# Patient Record
Sex: Male | Born: 1970
Health system: Southern US, Community
[De-identification: ages and names within clinical notes are randomized; demographics above are authoritative.]

## PROBLEM LIST (undated history)

## (undated) DIAGNOSIS — J45909 Unspecified asthma, uncomplicated: Secondary | ICD-10-CM

## (undated) DIAGNOSIS — K219 Gastro-esophageal reflux disease without esophagitis: Secondary | ICD-10-CM

## (undated) DIAGNOSIS — D169 Benign neoplasm of bone and articular cartilage, unspecified: Secondary | ICD-10-CM

## (undated) DIAGNOSIS — C801 Malignant (primary) neoplasm, unspecified: Secondary | ICD-10-CM

## (undated) DIAGNOSIS — R112 Nausea with vomiting, unspecified: Secondary | ICD-10-CM

## (undated) DIAGNOSIS — M1990 Unspecified osteoarthritis, unspecified site: Secondary | ICD-10-CM

## (undated) DIAGNOSIS — T7840XA Allergy, unspecified, initial encounter: Secondary | ICD-10-CM

## (undated) DIAGNOSIS — Z9889 Other specified postprocedural states: Secondary | ICD-10-CM

## (undated) DIAGNOSIS — T8859XA Other complications of anesthesia, initial encounter: Secondary | ICD-10-CM

## (undated) HISTORY — DX: Allergy, unspecified, initial encounter: T78.40XA

## (undated) HISTORY — PX: OSTEOTOMY FIBULA: SHX2141

## (undated) HISTORY — PX: DENTAL SURGERY: SHX609

## (undated) HISTORY — PX: UPPER GASTROINTESTINAL ENDOSCOPY: SHX188

## (undated) HISTORY — PX: WISDOM TOOTH EXTRACTION: SHX21

## (undated) HISTORY — PX: COLON SURGERY: SHX602

## (undated) HISTORY — DX: Gastro-esophageal reflux disease without esophagitis: K21.9

## (undated) HISTORY — PX: OTHER SURGICAL HISTORY: SHX169

## (undated) HISTORY — PX: NASAL RECONSTRUCTION: SHX2069

## (undated) HISTORY — DX: Benign neoplasm of bone and articular cartilage, unspecified: D16.9

---

## 2006-04-27 ENCOUNTER — Ambulatory Visit: Payer: Self-pay | Admitting: Family Medicine

## 2006-05-19 ENCOUNTER — Ambulatory Visit: Payer: Self-pay

## 2009-03-22 ENCOUNTER — Emergency Department (HOSPITAL_COMMUNITY): Admission: EM | Admit: 2009-03-22 | Discharge: 2009-03-22 | Payer: Self-pay | Admitting: Emergency Medicine

## 2009-03-25 ENCOUNTER — Emergency Department (HOSPITAL_COMMUNITY): Admission: EM | Admit: 2009-03-25 | Discharge: 2009-03-25 | Payer: Self-pay | Admitting: Emergency Medicine

## 2009-03-29 ENCOUNTER — Encounter (HOSPITAL_COMMUNITY): Admission: RE | Admit: 2009-03-29 | Discharge: 2009-06-27 | Payer: Self-pay | Admitting: Emergency Medicine

## 2016-11-24 DIAGNOSIS — M545 Low back pain, unspecified: Secondary | ICD-10-CM | POA: Insufficient documentation

## 2016-11-24 DIAGNOSIS — R072 Precordial pain: Secondary | ICD-10-CM | POA: Insufficient documentation

## 2018-08-16 DIAGNOSIS — M545 Low back pain: Secondary | ICD-10-CM | POA: Diagnosis not present

## 2018-08-16 DIAGNOSIS — G8929 Other chronic pain: Secondary | ICD-10-CM | POA: Diagnosis not present

## 2019-11-11 DIAGNOSIS — C801 Malignant (primary) neoplasm, unspecified: Secondary | ICD-10-CM

## 2019-11-11 HISTORY — DX: Malignant (primary) neoplasm, unspecified: C80.1

## 2020-03-23 ENCOUNTER — Encounter: Payer: Self-pay | Admitting: Physician Assistant

## 2020-03-23 ENCOUNTER — Ambulatory Visit (INDEPENDENT_AMBULATORY_CARE_PROVIDER_SITE_OTHER): Payer: BC Managed Care – PPO | Admitting: Physician Assistant

## 2020-03-23 ENCOUNTER — Other Ambulatory Visit: Payer: Self-pay

## 2020-03-23 VITALS — BP 108/64 | HR 59 | Temp 98.3°F | Resp 16 | Ht 68.0 in | Wt 173.2 lb

## 2020-03-23 DIAGNOSIS — K222 Esophageal obstruction: Secondary | ICD-10-CM

## 2020-03-23 DIAGNOSIS — Z1211 Encounter for screening for malignant neoplasm of colon: Secondary | ICD-10-CM

## 2020-03-23 DIAGNOSIS — Z125 Encounter for screening for malignant neoplasm of prostate: Secondary | ICD-10-CM | POA: Diagnosis not present

## 2020-03-23 DIAGNOSIS — Z0000 Encounter for general adult medical examination without abnormal findings: Secondary | ICD-10-CM

## 2020-03-23 DIAGNOSIS — R0789 Other chest pain: Secondary | ICD-10-CM | POA: Diagnosis not present

## 2020-03-23 DIAGNOSIS — K2100 Gastro-esophageal reflux disease with esophagitis, without bleeding: Secondary | ICD-10-CM | POA: Diagnosis not present

## 2020-03-23 LAB — CBC WITH DIFFERENTIAL/PLATELET
Basophils Absolute: 0.1 10*3/uL (ref 0.0–0.1)
Basophils Relative: 1.5 % (ref 0.0–3.0)
Eosinophils Absolute: 0.1 10*3/uL (ref 0.0–0.7)
Eosinophils Relative: 1.9 % (ref 0.0–5.0)
HCT: 43.7 % (ref 39.0–52.0)
Hemoglobin: 14.8 g/dL (ref 13.0–17.0)
Lymphocytes Relative: 42.8 % (ref 12.0–46.0)
Lymphs Abs: 2.1 10*3/uL (ref 0.7–4.0)
MCHC: 33.8 g/dL (ref 30.0–36.0)
MCV: 92.3 fl (ref 78.0–100.0)
Monocytes Absolute: 0.3 10*3/uL (ref 0.1–1.0)
Monocytes Relative: 7.2 % (ref 3.0–12.0)
Neutro Abs: 2.3 10*3/uL (ref 1.4–7.7)
Neutrophils Relative %: 46.6 % (ref 43.0–77.0)
Platelets: 251 10*3/uL (ref 150.0–400.0)
RBC: 4.74 Mil/uL (ref 4.22–5.81)
RDW: 13.8 % (ref 11.5–15.5)
WBC: 4.8 10*3/uL (ref 4.0–10.5)

## 2020-03-23 LAB — COMPREHENSIVE METABOLIC PANEL
ALT: 18 U/L (ref 0–53)
AST: 18 U/L (ref 0–37)
Albumin: 4.5 g/dL (ref 3.5–5.2)
Alkaline Phosphatase: 52 U/L (ref 39–117)
BUN: 12 mg/dL (ref 6–23)
CO2: 30 mEq/L (ref 19–32)
Calcium: 9.3 mg/dL (ref 8.4–10.5)
Chloride: 102 mEq/L (ref 96–112)
Creatinine, Ser: 0.9 mg/dL (ref 0.40–1.50)
GFR: 89.17 mL/min (ref >60.00)
Glucose, Bld: 81 mg/dL (ref 70–99)
Potassium: 5 mEq/L (ref 3.5–5.1)
Sodium: 138 mEq/L (ref 135–145)
Total Bilirubin: 1 mg/dL (ref 0.2–1.2)
Total Protein: 7 g/dL (ref 6.0–8.3)

## 2020-03-23 LAB — LIPID PANEL
Cholesterol: 191 mg/dL (ref 0–200)
HDL: 56.2 mg/dL (ref >39.00)
LDL Cholesterol: 123 mg/dL — ABNORMAL HIGH (ref 0–99)
NonHDL: 134.83
Total CHOL/HDL Ratio: 3
Triglycerides: 57 mg/dL (ref 0.0–149.0)
VLDL: 11.4 mg/dL (ref 0.0–40.0)

## 2020-03-23 LAB — TSH: TSH: 2.03 u[IU]/mL (ref 0.35–4.50)

## 2020-03-23 LAB — PSA: PSA: 1.08 ng/mL (ref 0.10–4.00)

## 2020-03-23 LAB — H. PYLORI ANTIBODY, IGG: H Pylori IgG: NEGATIVE

## 2020-03-23 MED ORDER — PANTOPRAZOLE SODIUM 40 MG PO TBEC: 40.0000 mg | DELAYED_RELEASE_TABLET | Freq: Every day | ORAL | 3 refills | Status: DC

## 2020-03-23 NOTE — Progress Notes (Signed)
Patient presents to clinic today to establish care.  Acute Concerns: Patient endorses around a year-long history of intermittent heartburn and reflux, becoming more consistent over the past couple of months. Is associated with difficulty swallowing with patient noting that he sometimes gets choked on foods and now even liquids on rare occasion.  Has noted occasional more r-sided chest discomfort in the past 5 weeks. States more so in the pectoral region. ?Notes some pain with movement on occasion and occasionally will note some discomfort with a deep breath. Can occur at rest and is not worsened with exertion. Denies fever, chills. Denies SOB or racing heart.   Health Maintenance: Immunizations -- Has had COVID vaccines. Unsure of last Tetanus. Will check records.   Colonoscopy -- Due. Average risk. Asymptomatic.   Past Medical History:  Diagnosis Date  . Allergy     Past Surgical History:  Procedure Laterality Date  . bonegraft     osteoasteoma  . DENTAL SURGERY      Current Outpatient Medications on File Prior to Visit  Medication Sig Dispense Refill  . cetirizine (ZYRTEC) 10 MG tablet Take 10 mg by mouth daily.     No current facility-administered medications on file prior to visit.    Allergies  Allergen Reactions  . Iodinated Diagnostic Agents Other (See Comments)    Family History  Problem Relation Age of Onset  . Dementia Mother   . Hypertension Father   . COPD Father   . Autism Daughter   . Depression Daughter     Social History   Socioeconomic History  . Marital status: Married    Spouse name: Not on file  . Number of children: Not on file  . Years of education: Not on file  . Highest education level: Not on file  Occupational History  . Not on file  Tobacco Use  . Smoking status: Never Smoker  . Smokeless tobacco: Never Used  Substance and Sexual Activity  . Alcohol use: Yes    Alcohol/week: 3.0 standard drinks    Types: 1 Glasses of wine, 1  Cans of beer, 1 Shots of liquor per week    Comment: ocassionally  . Drug use: Never  . Sexual activity: Not on file  Other Topics Concern  . Not on file  Social History Narrative  . Not on file   Social Determinants of Health   Financial Resource Strain:   . Difficulty of Paying Living Expenses:   Food Insecurity:   . Worried About Charity fundraiser in the Last Year:   . Arboriculturist in the Last Year:   Transportation Needs:   . Film/video editor (Medical):   Marland Kitchen Lack of Transportation (Non-Medical):   Physical Activity:   . Days of Exercise per Week:   . Minutes of Exercise per Session:   Stress:   . Feeling of Stress :   Social Connections:   . Frequency of Communication with Friends and Family:   . Frequency of Social Gatherings with Friends and Family:   . Attends Religious Services:   . Active Member of Clubs or Organizations:   . Attends Archivist Meetings:   Marland Kitchen Marital Status:   Intimate Partner Violence:   . Fear of Current or Ex-Partner:   . Emotionally Abused:   Marland Kitchen Physically Abused:   . Sexually Abused:    ROS Pertinent ROS are listed in the HPI.  BP 108/64   Pulse (!) 59  Temp 98.3 F (36.8 C)   Resp 16   Ht 5\' 8"  (1.727 m)   Wt 173 lb 3.2 oz (78.6 kg)   SpO2 99%   BMI 26.33 kg/m   Physical Exam Vitals reviewed.  Constitutional:      Appearance: Normal appearance.  HENT:     Head: Normocephalic and atraumatic.     Right Ear: Tympanic membrane normal.     Left Ear: Tympanic membrane normal.     Mouth/Throat:     Mouth: Mucous membranes are moist.  Eyes:     Conjunctiva/sclera: Conjunctivae normal.     Pupils: Pupils are equal, round, and reactive to light.  Cardiovascular:     Rate and Rhythm: Normal rate and regular rhythm.     Pulses: Normal pulses.     Heart sounds: Normal heart sounds.  Pulmonary:     Effort: Pulmonary effort is normal.     Breath sounds: Normal breath sounds.  Chest:     Chest wall: No  tenderness.  Abdominal:     General: Bowel sounds are normal. There is no distension.     Palpations: Abdomen is soft. There is no mass.     Tenderness: There is no abdominal tenderness. There is no guarding or rebound.     Hernia: No hernia is present.  Musculoskeletal:     Cervical back: Neck supple.  Neurological:     General: No focal deficit present.     Mental Status: He is alert and oriented to person, place, and time.  Psychiatric:        Mood and Affect: Mood normal.     Assessment/Plan: 1. Visit for preventive health examination Depression screen negative. Health Maintenance reviewed -- will get imm records. Preventive schedule discussed and handout given in AVS. Will obtain fasting labs today.  - CBC with Differential/Platelet - Comprehensive metabolic panel - Lipid panel - TSH  2. Prostate cancer screening The natural history of prostate cancer has been discussed with the patient. He indicates understanding of the limitations of this screening test and wishes to proceed with screening PSA testing.  - PSA  3. Atypical chest pain Seems combination of MSK inflammation and GERD. Examination unremarkable today. EKG today with sinus bradycardia. No concerning findings. Will start patient on PPI therapy and GERD diet for GERD. Trying to avoid NSAIDs due to GERD. Supportive measures and OTC medications reviewed. Avoid heavy lifting. Recheck 2 weeks.  - EKG 12-Lead - pantoprazole (PROTONIX) 40 MG tablet; Take 1 tablet (40 mg total) by mouth daily.  Dispense: 30 tablet; Refill: 3  4. Gastroesophageal reflux disease with esophagitis without hemorrhage Significant and concern for stricture. Start Protonix 40 mg daily along with GERD diet. Labs today to include h pylori testing. Follow-up scheduled.  - pantoprazole (PROTONIX) 40 MG tablet; Take 1 tablet (40 mg total) by mouth daily.  Dispense: 30 tablet; Refill: 3 - Comprehensive metabolic panel - Lipid panel - H. pylori  antibody, IgG  5. Esophageal stricture PPI as directed. Referral placed to GI for EGD and further management if present.  - pantoprazole (PROTONIX) 40 MG tablet; Take 1 tablet (40 mg total) by mouth daily.  Dispense: 30 tablet; Refill: 3   Leeanne Rio, [place]

## 2020-03-23 NOTE — Patient Instructions (Signed)
Please go to the lab today for blood work.  I will call you with your results. We will alter treatment regimen(s) if indicated by your results.   Please start nightly cyclobenzaprine over the next week. I want to see if this eases muscular tension in the chest and resolves those r-sided symptoms. If not improving will proceed with imaging.   For the bad reflux, start the diet below. Also take the Protonix once daily. Giving concern for stricture we are setting you up with Gastroenterology. They will also help facilitate your colon cancer screenings.   Follow-up in 2 weeks.   Food Choices for Gastroesophageal Reflux Disease, Adult When you have gastroesophageal reflux disease (GERD), the foods you eat and your eating habits are very important. Choosing the right foods can help ease your discomfort. Think about working with a nutrition specialist (dietitian) to help you make good choices. What are tips for following this plan?  Meals  Choose healthy foods that are low in fat, such as fruits, vegetables, whole grains, low-fat dairy products, and lean meat, fish, and poultry.  Eat small meals often instead of 3 large meals a day. Eat your meals slowly, and in a place where you are relaxed. Avoid bending over or lying down until 2-3 hours after eating.  Avoid eating meals 2-3 hours before bed.  Avoid drinking a lot of liquid with meals.  Cook foods using methods other than frying. Bake, grill, or broil food instead.  Avoid or limit: ? Chocolate. ? Peppermint or spearmint. ? Alcohol. ? Pepper. ? Black and decaffeinated coffee. ? Black and decaffeinated tea. ? Bubbly (carbonated) soft drinks. ? Caffeinated energy drinks and soft drinks.  Limit high-fat foods such as: ? Fatty meat or fried foods. ? Whole milk, cream, butter, or ice cream. ? Nuts and nut butters. ? Pastries, donuts, and sweets made with butter or shortening.  Avoid foods that cause symptoms. These foods may be  different for everyone. Common foods that cause symptoms include: ? Tomatoes. ? Oranges, lemons, and limes. ? Peppers. ? Spicy food. ? Onions and garlic. ? Vinegar. Lifestyle  Maintain a healthy weight. Ask your doctor what weight is healthy for you. If you need to lose weight, work with your doctor to do so safely.  Exercise for at least 30 minutes for 5 or more days each week, or as told by your doctor.  Wear loose-fitting clothes.  Do not smoke. If you need help quitting, ask your doctor.  Sleep with the head of your bed higher than your feet. Use a wedge under the mattress or blocks under the bed frame to raise the head of the bed. Summary  When you have gastroesophageal reflux disease (GERD), food and lifestyle choices are very important in easing your symptoms.  Eat small meals often instead of 3 large meals a day. Eat your meals slowly, and in a place where you are relaxed.  Limit high-fat foods such as fatty meat or fried foods.  Avoid bending over or lying down until 2-3 hours after eating.  Avoid peppermint and spearmint, caffeine, alcohol, and chocolate. This information is not intended to replace advice given to you by your health care provider. Make sure you discuss any questions you have with your health care provider. Document Revised: 02/17/2019 Document Reviewed: 12/02/2016 Elsevier Patient Education  2020 Else[employer] Inc[employer]   Preventive Care 61-87 Years Old, Male Preventive care refers to lifestyle choices and visits with your health care provider that can promote health  and wellness. This includes:  A yearly physical exam. This is also called an annual well check.  Regular dental and eye exams.  Immunizations.  Screening for certain conditions.  Healthy lifestyle choices, such as eating a healthy diet, getting regular exercise, not using drugs or products that contain nicotine and tobacco, and limiting alcohol use. What can I expect for my preventive care  visit? Physical exam Your health care provider will check:  Height and weight. These may be used to calculate body mass index (BMI), which is a measurement that tells if you are at a healthy weight.  Heart rate and blood pressure.  Your skin for abnormal spots. Counseling Your health care provider may ask you questions about:  Alcohol, tobacco, and drug use.  Emotional well-being.  Home and relationship well-being.  Sexual activity.  Eating habits.  Work and work Statistician. What immunizations do I need?  Influenza (flu) vaccine  This is recommended every year. Tetanus, diphtheria, and pertussis (Tdap) vaccine  You may need a Td booster every 10 years. Varicella (chickenpox) vaccine  You may need this vaccine if you have not already been vaccinated. Zoster (shingles) vaccine  You may need this after age 70. Measles, mumps, and rubella (MMR) vaccine  You may need at least one dose of MMR if you were born in 1957 or later. You may also need a second dose. Pneumococcal conjugate (PCV13) vaccine  You may need this if you have certain conditions and were not previously vaccinated. Pneumococcal polysaccharide (PPSV23) vaccine  You may need one or two doses if you smoke cigarettes or if you have certain conditions. Meningococcal conjugate (MenACWY) vaccine  You may need this if you have certain conditions. Hepatitis A vaccine  You may need this if you have certain conditions or if you travel or work in places where you may be exposed to hepatitis A. Hepatitis B vaccine  You may need this if you have certain conditions or if you travel or work in places where you may be exposed to hepatitis B. Haemophilus influenzae type b (Hib) vaccine  You may need this if you have certain risk factors. Human papillomavirus (HPV) vaccine  If recommended by your health care provider, you may need three doses over 6 months. You may receive vaccines as individual doses or as more  than one vaccine together in one shot (combination vaccines). Talk with your health care provider about the risks and benefits of combination vaccines. What tests do I need? Blood tests  Lipid and cholesterol levels. These may be checked every 5 years, or more frequently if you are over 54 years old.  Hepatitis C test.  Hepatitis B test. Screening  Lung cancer screening. You may have this screening every year starting at age 70 if you have a 30-pack-year history of smoking and currently smoke or have quit within the past 15 years.  Prostate cancer screening. Recommendations will vary depending on your family history and other risks.  Colorectal cancer screening. All adults should have this screening starting at age 103 and continuing until age 19. Your health care provider may recommend screening at age 43 if you are at increased risk. You will have tests every 1-10 years, depending on your results and the type of screening test.  Diabetes screening. This is done by checking your blood sugar (glucose) after you have not eaten for a while (fasting). You may have this done every 1-3 years.  Sexually transmitted disease (STD) testing. Follow these instructions at  home: Eating and drinking  Eat a diet that includes fresh fruits and vegetables, whole grains, lean protein, and low-fat dairy products.  Take vitamin and mineral supplements as recommended by your health care provider.  Do not drink alcohol if your health care provider tells you not to drink.  If you drink alcohol: ? Limit how much you have to 0-2 drinks a day. ? Be aware of how much alcohol is in your drink. In the [place] one drink equals one 12 oz bottle of beer (355 mL), one 5 oz glass of wine (148 mL), or one 1 oz glass of hard liquor (44 mL). Lifestyle  Take daily care of your teeth and gums.  Stay active. Exercise for at least 30 minutes on 5 or more days each week.  Do not use any products that contain nicotine or  tobacco, such as cigarettes, e-cigarettes, and chewing tobacco. If you need help quitting, ask your health care provider.  If you are sexually active, practice safe sex. Use a condom or other form of protection to prevent STIs (sexually transmitted infections).  Talk with your health care provider about taking a low-dose aspirin every day starting at age 12. What's next?  Go to your health care provider once a year for a well check visit.  Ask your health care provider how often you should have your eyes and teeth checked.  Stay up to date on all vaccines. This information is not intended to replace advice given to you by your health care provider. Make sure you discuss any questions you have with your health care provider. Document Revised: 10/21/2018 Document Reviewed: 10/21/2018 Elsevier Patient Education  2020 [employer].

## 2020-03-26 ENCOUNTER — Encounter: Payer: Self-pay | Admitting: Physician Assistant

## 2020-03-26 ENCOUNTER — Other Ambulatory Visit: Payer: Self-pay | Admitting: Physician Assistant

## 2020-03-26 MED ORDER — CYCLOBENZAPRINE HCL 10 MG PO TABS
10.0000 mg | ORAL_TABLET | Freq: Every day | ORAL | 0 refills | Status: DC
Start: 2020-03-26 — End: 2020-04-06

## 2020-04-06 ENCOUNTER — Ambulatory Visit (INDEPENDENT_AMBULATORY_CARE_PROVIDER_SITE_OTHER): Payer: BC Managed Care – PPO

## 2020-04-06 ENCOUNTER — Other Ambulatory Visit: Payer: BC Managed Care – PPO

## 2020-04-06 ENCOUNTER — Other Ambulatory Visit: Payer: Self-pay

## 2020-04-06 ENCOUNTER — Ambulatory Visit (INDEPENDENT_AMBULATORY_CARE_PROVIDER_SITE_OTHER): Payer: BC Managed Care – PPO | Admitting: Physician Assistant

## 2020-04-06 ENCOUNTER — Encounter: Payer: Self-pay | Admitting: Physician Assistant

## 2020-04-06 ENCOUNTER — Other Ambulatory Visit: Payer: Self-pay | Admitting: Emergency Medicine

## 2020-04-06 VITALS — BP 110/70 | HR 80 | Temp 98.1°F | Resp 14 | Ht 68.0 in | Wt 174.0 lb

## 2020-04-06 DIAGNOSIS — K222 Esophageal obstruction: Secondary | ICD-10-CM

## 2020-04-06 DIAGNOSIS — R0789 Other chest pain: Secondary | ICD-10-CM | POA: Diagnosis not present

## 2020-04-06 DIAGNOSIS — K2100 Gastro-esophageal reflux disease with esophagitis, without bleeding: Secondary | ICD-10-CM

## 2020-04-06 DIAGNOSIS — R079 Chest pain, unspecified: Secondary | ICD-10-CM | POA: Diagnosis not present

## 2020-04-06 IMAGING — DX DG CHEST 2V
2 series · 2 of 2 positions shown · non-contrast
Comparison: None.

CLINICAL DATA: Chest pain

EXAM:
CHEST - 2 VIEW

[chest pa]
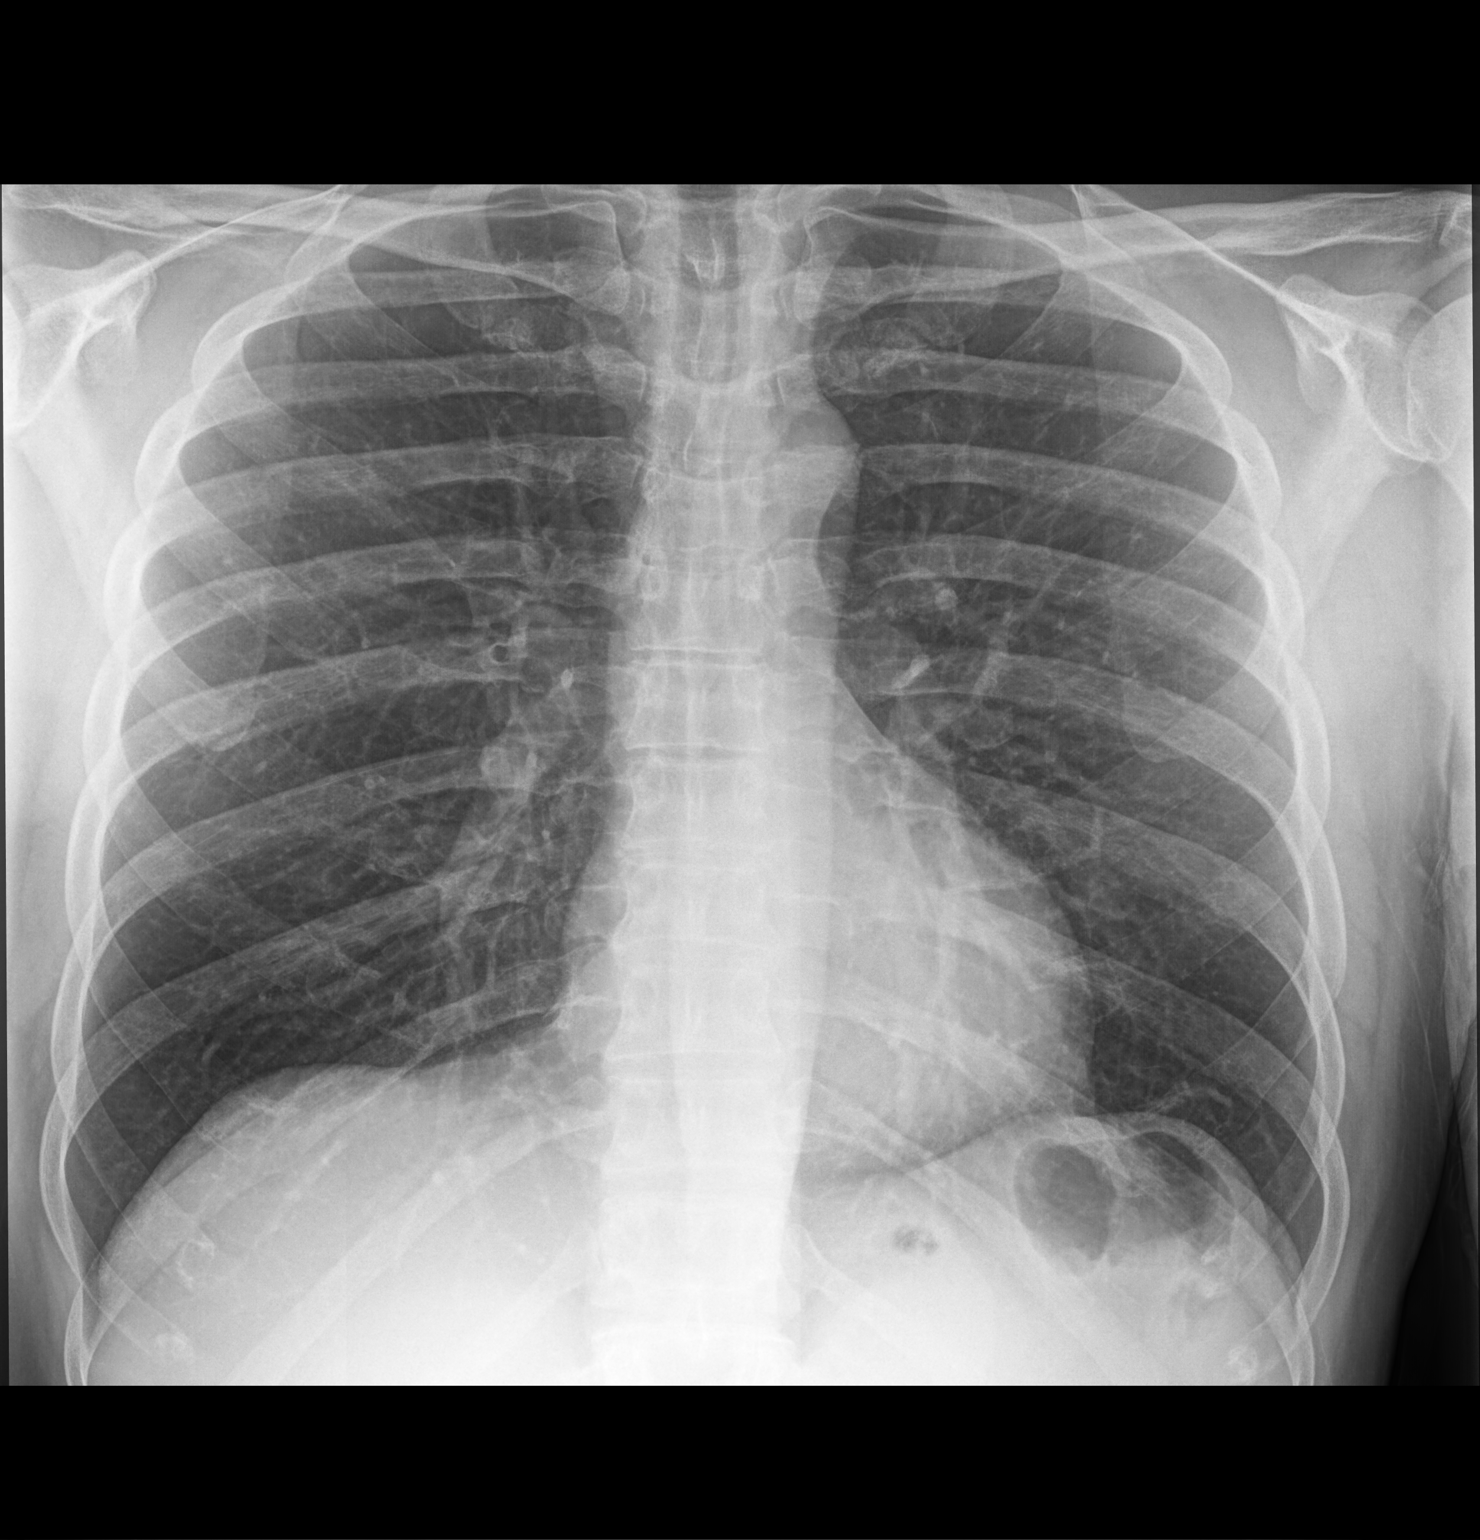

[chest lat]
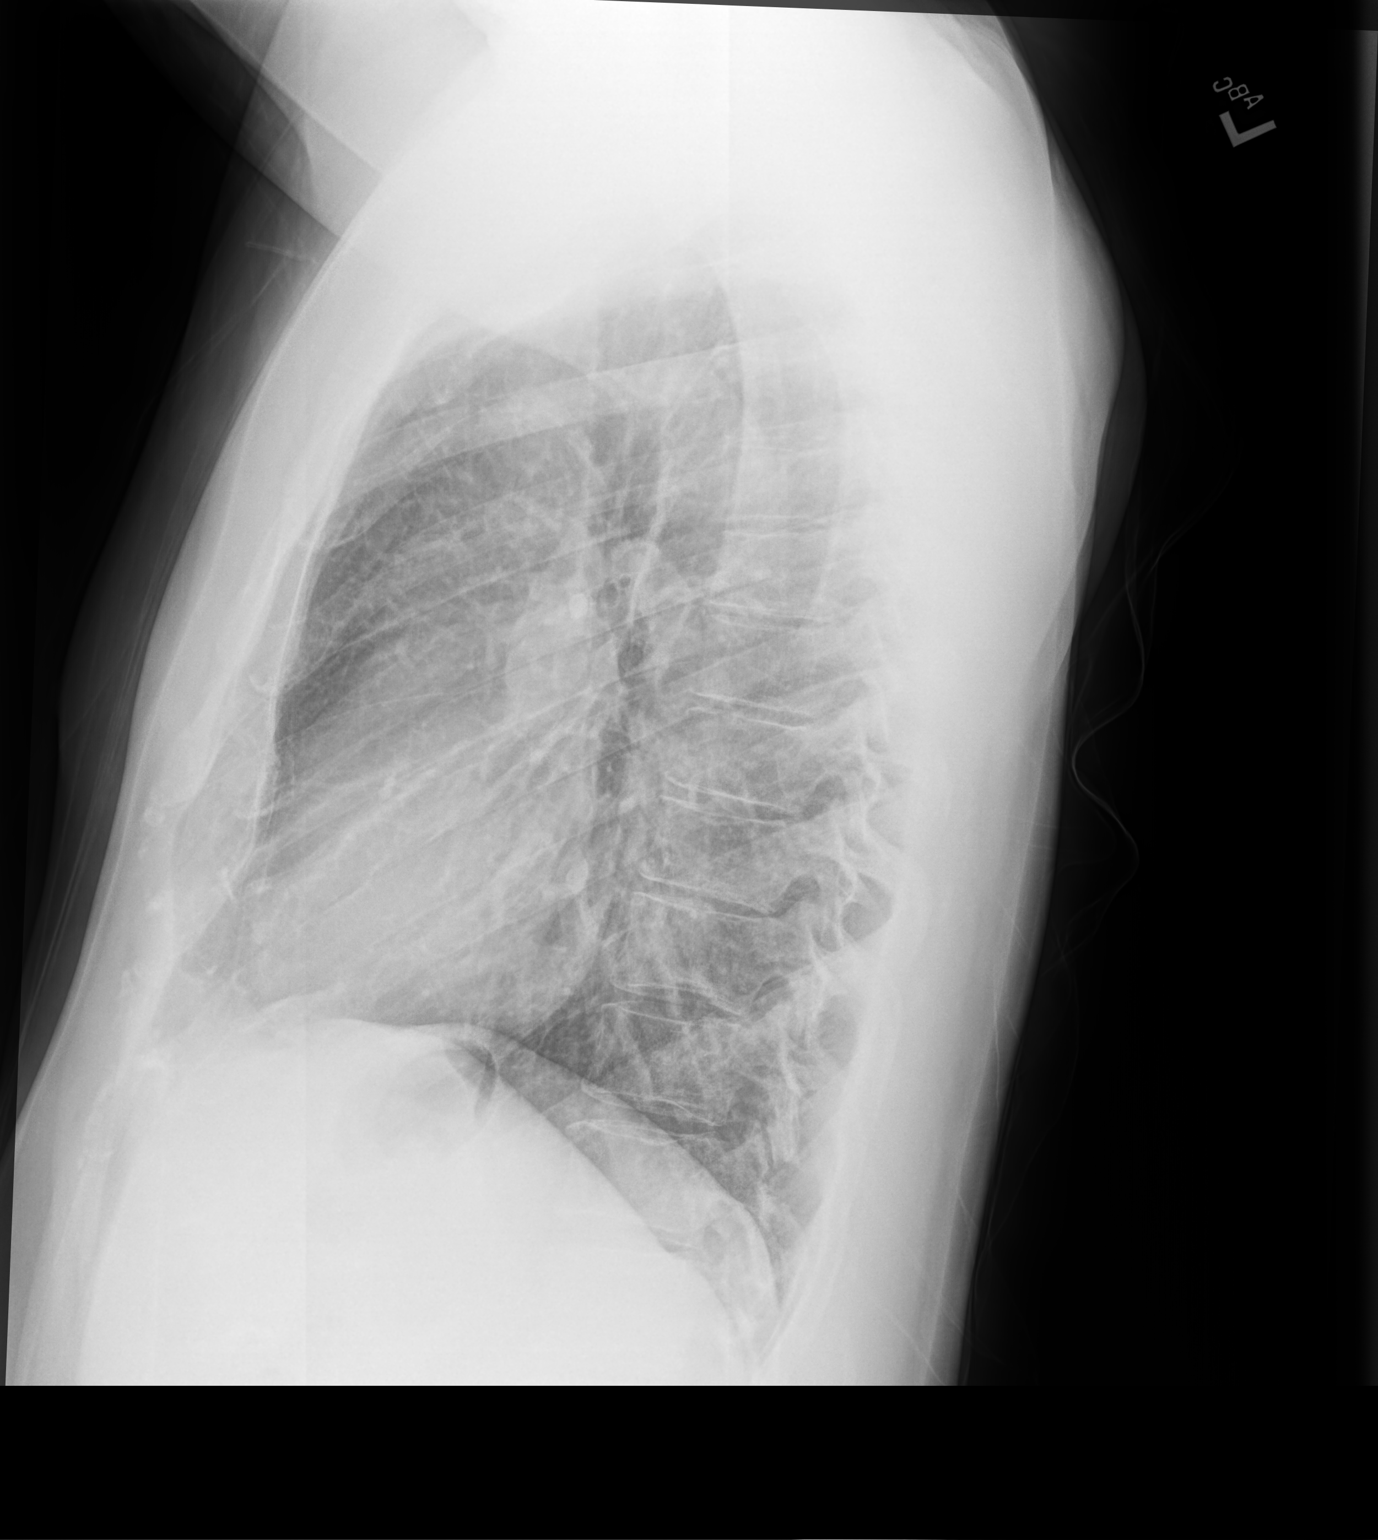

[2 of 2 positions shown; findings below may reference images not displayed]

FINDINGS: Lungs are clear. Heart size and pulmonary vascularity are normal. No
adenopathy. No pneumothorax. No bone lesions.
IMPRESSION: Lungs clear.  Cardiac silhouette within normal limits.

## 2020-04-06 MED ORDER — PANTOPRAZOLE SODIUM 40 MG PO TBEC: 40.0000 mg | DELAYED_RELEASE_TABLET | Freq: Every day | ORAL | 1 refills | Status: DC

## 2020-04-06 NOTE — Patient Instructions (Signed)
Please continue the Protonix daily along with dietary recommendations below.  Continue this through your appointment with Gastroenterology. They may make further changes to your regimen at that time.  Please go up front to speak with Danae Chen to get your x-ray scheduled this morning.   Please go to the [facility] office for x-ray. We will call you with your results and alter treatment accordingly.   [facility] [place]  [place], [place] [address]   Food Choices for Gastroesophageal Reflux Disease, Adult When you have gastroesophageal reflux disease (GERD), the foods you eat and your eating habits are very important. Choosing the right foods can help ease your discomfort. Think about working with a nutrition specialist (dietitian) to help you make good choices. What are tips for following this plan?  Meals  Choose healthy foods that are low in fat, such as fruits, vegetables, whole grains, low-fat dairy products, and lean meat, fish, and poultry.  Eat small meals often instead of 3 large meals a day. Eat your meals slowly, and in a place where you are relaxed. Avoid bending over or lying down until 2-3 hours after eating.  Avoid eating meals 2-3 hours before bed.  Avoid drinking a lot of liquid with meals.  Cook foods using methods other than frying. Bake, grill, or broil food instead.  Avoid or limit: ? Chocolate. ? Peppermint or spearmint. ? Alcohol. ? Pepper. ? Black and decaffeinated coffee. ? Black and decaffeinated tea. ? Bubbly (carbonated) soft drinks. ? Caffeinated energy drinks and soft drinks.  Limit high-fat foods such as: ? Fatty meat or fried foods. ? Whole milk, cream, butter, or ice cream. ? Nuts and nut butters. ? Pastries, donuts, and sweets made with butter or shortening.  Avoid foods that cause symptoms. These foods may be different for everyone. Common foods that cause symptoms include: ? Tomatoes. ? Oranges, lemons, and  limes. ? Peppers. ? Spicy food. ? Onions and garlic. ? Vinegar. Lifestyle  Maintain a healthy weight. Ask your doctor what weight is healthy for you. If you need to lose weight, work with your doctor to do so safely.  Exercise for at least 30 minutes for 5 or more days each week, or as told by your doctor.  Wear loose-fitting clothes.  Do not smoke. If you need help quitting, ask your doctor.  Sleep with the head of your bed higher than your feet. Use a wedge under the mattress or blocks under the bed frame to raise the head of the bed. Summary  When you have gastroesophageal reflux disease (GERD), food and lifestyle choices are very important in easing your symptoms.  Eat small meals often instead of 3 large meals a day. Eat your meals slowly, and in a place where you are relaxed.  Limit high-fat foods such as fatty meat or fried foods.  Avoid bending over or lying down until 2-3 hours after eating.  Avoid peppermint and spearmint, caffeine, alcohol, and chocolate. This information is not intended to replace advice given to you by your health care provider. Make sure you discuss any questions you have with your health care provider. Document Revised: 02/17/2019 Document Reviewed: 12/02/2016 Elsevier Patient Education  [employer].

## 2020-04-06 NOTE — Progress Notes (Signed)
Patient presents to clinic today for follow-up regarding GERD, difficulty swallowing and atypical chest pain.  At last visit patient was started on a regimen of Protonix 40 mg once daily and GERD diet after initial work-up in office including examination, blood work and EKG unremarkable.  Patient endorses taking the Protonix daily as directed.  Has noted substantial improvement in reflux symptoms, noting only 2 episodes of breakthrough symptoms since last visit.  Notes it is easier to swallow but still has occasional difficulty swallowing solids.  Has GI appointment upcoming for further evaluation with EGD.  In regards to chest pain, patient still endorsing intermittent right-sided chest pain, noted as a pulling sensation.  Still noting some discomfort with taking a deep breath.  Denies radiation of pain elsewhere.  Denies shortness of breath.  Has tried avoid heavy lifting and overexertion.  Past Medical History:  Diagnosis Date  . Allergy     Current Outpatient Medications on File Prior to Visit  Medication Sig Dispense Refill  . cetirizine (ZYRTEC) 10 MG tablet Take 10 mg by mouth daily.    . pantoprazole (PROTONIX) 40 MG tablet Take 1 tablet (40 mg total) by mouth daily. 30 tablet 3  . cyclobenzaprine (FLEXERIL) 10 MG tablet Take 1 tablet (10 mg total) by mouth at bedtime. (Patient not taking: Reported on 04/06/2020) 15 tablet 0   No current facility-administered medications on file prior to visit.    Allergies  Allergen Reactions  . Iodinated Diagnostic Agents Other (See Comments)    Jittery, dizziness, palpitations    Family History  Problem Relation Age of Onset  . Dementia Mother 73       Alzheimer's  . Colon polyps Mother   . Hypertension Father   . COPD Father   . Autism Daughter   . Depression Daughter   . Lung cancer Paternal Grandfather        Smoker  . Dementia Maternal Uncle        Alzheimer's  . Lung cancer Paternal Uncle        w/ metastatsis; Unsure of smoking  status    Social History   Socioeconomic History  . Marital status: Married    Spouse name: Not on file  . Number of children: Not on file  . Years of education: Not on file  . Highest education level: Not on file  Occupational History  . Not on file  Tobacco Use  . Smoking status: Never Smoker  . Smokeless tobacco: Never Used  Substance and Sexual Activity  . Alcohol use: Yes    Alcohol/week: 3.0 standard drinks    Types: 1 Glasses of wine, 1 Cans of beer, 1 Shots of liquor per week    Comment: ocassionally  . Drug use: Never  . Sexual activity: Yes  Other Topics Concern  . Not on file  Social History Narrative  . Not on file   Social Determinants of Health   Financial Resource Strain:   . Difficulty of Paying Living Expenses:   Food Insecurity:   . Worried About Charity fundraiser in the Last Year:   . Arboriculturist in the Last Year:   Transportation Needs:   . Film/video editor (Medical):   Marland Kitchen Lack of Transportation (Non-Medical):   Physical Activity:   . Days of Exercise per Week:   . Minutes of Exercise per Session:   Stress:   . Feeling of Stress :   Social Connections:   . Frequency  of Communication with Friends and Family:   . Frequency of Social Gatherings with Friends and Family:   . Attends Religious Services:   . Active Member of Clubs or Organizations:   . Attends Archivist Meetings:   Marland Kitchen Marital Status:    Review of Systems - See HPI.  All other ROS are negative.  Wt 174 lb (78.9 kg)   BMI 26.46 kg/m   Physical Exam Vitals reviewed.  Constitutional:      Appearance: He is well-developed.  HENT:     Head: Normocephalic and atraumatic.  Cardiovascular:     Rate and Rhythm: Normal rate and regular rhythm.     Heart sounds: Normal heart sounds.  Pulmonary:     Effort: Pulmonary effort is normal.     Breath sounds: Normal breath sounds.  Abdominal:     General: Bowel sounds are normal.     Palpations: Abdomen is soft.      Tenderness: There is no abdominal tenderness.  Musculoskeletal:     Cervical back: Neck supple.  Neurological:     Mental Status: He is alert.     Recent Results (from the past 2160 hour(s))  CBC with Differential/Platelet     Status: None   Collection Time: 03/23/20  9:57 AM  Result Value Ref Range   WBC 4.8 4.0 - 10.5 K/uL   RBC 4.74 4.22 - 5.81 Mil/uL   Hemoglobin 14.8 13.0 - 17.0 g/dL   HCT 43.7 39.0 - 52.0 %   MCV 92.3 78.0 - 100.0 fl   MCHC 33.8 30.0 - 36.0 g/dL   RDW 13.8 11.5 - 15.5 %   Platelets 251.0 150.0 - 400.0 K/uL   Neutrophils Relative % 46.6 43.0 - 77.0 %   Lymphocytes Relative 42.8 12.0 - 46.0 %   Monocytes Relative 7.2 3.0 - 12.0 %   Eosinophils Relative 1.9 0.0 - 5.0 %   Basophils Relative 1.5 0.0 - 3.0 %   Neutro Abs 2.3 1.4 - 7.7 K/uL   Lymphs Abs 2.1 0.7 - 4.0 K/uL   Monocytes Absolute 0.3 0.1 - 1.0 K/uL   Eosinophils Absolute 0.1 0.0 - 0.7 K/uL   Basophils Absolute 0.1 0.0 - 0.1 K/uL  Comprehensive metabolic panel     Status: None   Collection Time: 03/23/20  9:57 AM  Result Value Ref Range   Sodium 138 135 - 145 mEq/L   Potassium 5.0 3.5 - 5.1 mEq/L   Chloride 102 96 - 112 mEq/L   CO2 30 19 - 32 mEq/L   Glucose, Bld 81 70 - 99 mg/dL   BUN 12 6 - 23 mg/dL   Creatinine, Ser 0.90 0.40 - 1.50 mg/dL   Total Bilirubin 1.0 0.2 - 1.2 mg/dL   Alkaline Phosphatase 52 39 - 117 U/L   AST 18 0 - 37 U/L   ALT 18 0 - 53 U/L   Total Protein 7.0 6.0 - 8.3 g/dL   Albumin 4.5 3.5 - 5.2 g/dL   GFR 89.17 >60.00 mL/min   Calcium 9.3 8.4 - 10.5 mg/dL  Lipid panel     Status: Abnormal   Collection Time: 03/23/20  9:57 AM  Result Value Ref Range   Cholesterol 191 0 - 200 mg/dL    Comment: ATP III Classification       Desirable:  < 200 mg/dL               Borderline High:  200 - 239 mg/dL  High:  > = 240 mg/dL   Triglycerides 57.0 0.0 - 149.0 mg/dL    Comment: Normal:  <150 mg/dLBorderline High:  150 - 199 mg/dL   HDL 56.20 >39.00 mg/dL   VLDL 11.4 0.0  - 40.0 mg/dL   LDL Cholesterol 123 (H) 0 - 99 mg/dL   Total CHOL/HDL Ratio 3     Comment:                Men          Women1/2 Average Risk     3.4          3.3Average Risk          5.0          4.42X Average Risk          9.6          7.13X Average Risk          15.0          11.0                       NonHDL 134.83     Comment: NOTE:  Non-HDL goal should be 30 mg/dL higher than patient's LDL goal (i.e. LDL goal of < 70 mg/dL, would have non-HDL goal of < 100 mg/dL)  PSA     Status: None   Collection Time: 03/23/20  9:57 AM  Result Value Ref Range   PSA 1.08 0.10 - 4.00 ng/mL    Comment: Test performed using Access Hybritech PSA Assay, a parmagnetic partical, chemiluminecent immunoassay.  TSH     Status: None   Collection Time: 03/23/20  9:57 AM  Result Value Ref Range   TSH 2.03 0.35 - 4.50 uIU/mL  H. pylori antibody, IgG     Status: None   Collection Time: 03/23/20  9:57 AM  Result Value Ref Range   H Pylori IgG Negative Negative    Assessment/Plan: 1. Gastroesophageal reflux disease with esophagitis without hemorrhage 2. Esophageal stricture Improving.  GI appointment scheduled.  Will continue Protonix and GERD diet for now. - pantoprazole (PROTONIX) 40 MG tablet; Take 1 tablet (40 mg total) by mouth daily.  Dispense: 90 tablet; Refill: 1  3. Atypical chest pain Seems most likely musculoskeletal in nature giving pulling sensation.  Discussed that muscles in this area are going to be pressed on when taking a deep breath.  Will get chest x-ray today to make sure things look clear.  As long as this is negative is expected will refer him to sports medicine/physical therapy. - DG Chest 2 View; Future  This visit occurred during the SARS-CoV-2 public health emergency.  Safety protocols were in place, including screening questions prior to the visit, additional usage of staff PPE, and extensive cleaning of exam room while observing appropriate contact time as indicated for disinfecting  solutions.     Leeanne Rio, PA-C

## 2020-04-18 ENCOUNTER — Ambulatory Visit: Payer: BC Managed Care – PPO | Admitting: Physician Assistant

## 2020-05-10 ENCOUNTER — Encounter: Payer: Self-pay | Admitting: *Deleted

## 2020-05-11 ENCOUNTER — Ambulatory Visit (INDEPENDENT_AMBULATORY_CARE_PROVIDER_SITE_OTHER): Payer: BC Managed Care – PPO | Admitting: Nurse Practitioner

## 2020-05-11 ENCOUNTER — Encounter: Payer: Self-pay | Admitting: Nurse Practitioner

## 2020-05-11 VITALS — BP 110/74 | HR 56 | Ht 67.25 in | Wt 171.2 lb

## 2020-05-11 DIAGNOSIS — R1310 Dysphagia, unspecified: Secondary | ICD-10-CM

## 2020-05-11 DIAGNOSIS — K219 Gastro-esophageal reflux disease without esophagitis: Secondary | ICD-10-CM | POA: Diagnosis not present

## 2020-05-11 DIAGNOSIS — Z1211 Encounter for screening for malignant neoplasm of colon: Secondary | ICD-10-CM | POA: Diagnosis not present

## 2020-05-11 DIAGNOSIS — Z8371 Family history of colonic polyps: Secondary | ICD-10-CM

## 2020-05-11 DIAGNOSIS — Z800 Family history of malignant neoplasm of digestive organs: Secondary | ICD-10-CM

## 2020-05-11 MED ORDER — PLENVU 140 G PO SOLR
1.0000 | ORAL | 0 refills | Status: DC
Start: 2020-05-11 — End: 2020-06-13

## 2020-05-11 NOTE — Progress Notes (Signed)
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  He presents to our office today as referred by his primary care provider Sean Noble, PA-C for further evaluation regarding GERD.  He complains of having heartburn which started 2 years ago.  Initially his heartburn occurred once weekly and eventually came in waves.  He has heartburn for a few days then it goes away for several days.  Recently, when laying down in bed he is having frequent burping.  He complains of difficulty swallowing which started approximately 9 months ago which has progressively worsened over the past few months.  He reports having food such as bread which gets stuck to the upper esophagus which occurs once or twice weekly.  Drinking water worsens his symptoms.  He stops eating and waits 2 to 3 minutes and the stuck food passes down the esophagus.  He does not cough, gag or vomit to expel out the stuck food.  He started Pantoprazole 40 mg daily 03/23/2020 and his heartburn symptoms have lessened.  He denies having any nausea or vomiting.  No upper or lower abdominal pain.  No NSAID use. He is passing a normal formed brown bowel movement daily.  No rectal bleeding or melena.  His brother has a history of a Schatzki's ring which she required dilatation.  His mother has a history of colon polyps in his father possibly has a history of colon polyps as well.  No family history of esophageal, gastric or colon cancer.  He reports having right chest pain which started 2 months ago. He was seen by his PCP for his right chest pain. A 12 lead EKG showed sinus bradycardia without evidence of acute ischemia.  A chest x-ray was negative.  H. pylori IgG antibody was negative.  He was assessed to have  musculoskeletal chest wall pain and physical therapy was recommended which he has not yet started.  He continues to have right-sided chest pain which is mostly noticeable when he takes a deep breath in. Eating does not trigger this pain.  He lifts weights on a regular basis and he does not experience any chest pain when lifting weights or during exercise.  Touching his chest wall does not reproduce his pain.  No associated shortness of breath, dizziness or palpitations.  His weight is stable.  Received 2 COVID-19 vaccinations.  Today his HR is 56b/min. He stated his HR typically runs in the 50's. BP 101/70. No other complaints today.  CBC Latest Ref Rng & Units 03/23/2020  WBC 4.0 - 10.5 K/uL 4.8  Hemoglobin 13.0 - 17.0 g/dL 14.8  Hematocrit 39 - 52 % 43.7  Platelets 150 - 400 K/uL 251.0   CMP Latest Ref Rng & Units 03/23/2020  Glucose 70 - 99 mg/dL 81  BUN 6 - 23 mg/dL 12  Creatinine 0.40 - 1.50 mg/dL 0.90  Sodium 135 - 145 mEq/L 138  Potassium 3.5 - 5.1 mEq/L 5.0  Chloride 96 - 112 mEq/L 102  CO2 19 - 32 mEq/L 30  Calcium 8.4 - 10.5 mg/dL 9.3  Total Protein 6.0 - 8.3 g/dL 7.0  Total Bilirubin 0.2 - 1.2 mg/dL 1.0  Alkaline Phos 39 - 117 U/L 52  AST 0 - 37 U/L 18  ALT  0 - 53 U/L 18  TSH 2.03.   Past Medical History:  Diagnosis Date  . Allergy   . GERD (gastroesophageal reflux disease)   . Osteoid osteoma    Past Surgical History:  Procedure Laterality Date  . DENTAL SURGERY    . NASAL RECONSTRUCTION    . OSTEOTOMY FIBULA     osteoasteoma   Social History: He is an Chief Financial Officer.  He is married. He has 3 daughters. Nonsmoker. He drinks 6 pack of beer lasts a week. No drug use.   Family History: Mother age 7 with Alzheimer's disease. Father age 76 COPD. Paternal grandfather and paternal uncle with history of lung cancer.   Allergies  Allergen Reactions  . Iodinated Diagnostic Agents Other (See Comments)    Jittery, dizziness, palpitations      Outpatient Encounter Medications  as of 05/11/2020  Medication Sig  . cetirizine (ZYRTEC) 10 MG tablet Take 10 mg by mouth daily.  . pantoprazole (PROTONIX) 40 MG tablet Take 1 tablet (40 mg total) by mouth daily.   No facility-administered encounter medications on file as of 05/11/2020.     REVIEW OF SYSTEMS:  Gen: Denies fever, sweats or chills. No weight loss.  CV: See HPI. Resp: Denies cough, shortness of breath of hemoptysis.  GI: See HPI. GU : Denies urinary burning, blood in urine, increased urinary frequency or incontinence. MS: Denies joint pain, muscles aches or weakness. Derm: Denies rash, itchiness, skin lesions or unhealing ulcers. Psych: Denies depression or anxiety. Heme: Denies bruising, bleeding. Neuro:  Denies headaches, dizziness or paresthesias. Endo:  Denies any problems with DM, thyroid or adrenal function.   PHYSICAL EXAM: BP 110/74 (BP Location: Left Arm, Patient Position: Sitting, Cuff Size: Normal)   Pulse (!) 56   Ht 5' 7.25" (1.708 m) Comment: height measured without shoes  Wt 171 lb 4 oz (77.7 kg)   BMI 26.62 kg/m    General: Well developed 49 year old male in NAD. Head: Normocephalic and atraumatic. Eyes:  Sclerae non-icteric, conjunctive pink. Ears: Normal auditory acuity. Mouth: Dentition intact. No ulcers or lesions.  Neck: Supple, no lymphadenopathy or thyromegaly.  Lungs: Clear bilaterally to auscultation without wheezes, crackles or rhonchi.  Note: The patient's right chestpain was reproduced with deep inspiration, pain radiated to his right axilla.  Heart: Bradycardic. No murmur, rub or gallop appreciated.  Abdomen: Soft, nontender, non distended. No masses. No hepatosplenomegaly. Normoactive bowel sounds x 4 quadrants.  Rectal: Deferred. Musculoskeletal: Symmetrical with no gross deformities. Skin: Warm and dry. No rash or lesions on visible extremities. Extremities: No edema. Neurological: Alert oriented x 4, no focal deficits.  Psychological:  Alert and cooperative.  Normal mood and affect.  ASSESSMENT AND PLAN:  45. 49 year old male with heartburn and dysphagia. -EGD benefits and risks discussed including risk with sedation, risk of bleeding, perforation and infection -Continue Pantoprazole 40mg  once daily  -Advised patient to drink 3 sips of water prior to swallowing any medications of food, avoid eating in the car, cut food in small pieces and chew food thoroughly.  -Patient to call our office if symptoms worsen  2. Screening colonoscopy. Family history of colon polyps (mother, possibly father) -Colonoscopy benefits and risks discussed including risk with sedation, risk of bleeding, perforation and infection   3. Right chest pain, pleuritic -I advised the patient to follow up with his PCP regarding right chest/right axillary pain which occurs with deep inspiration. Consider chest CT for further evaluation, defer to PCP.   Further  follow-up to be determined after the above evaluation completed           CC:  Brunetta Jeans, PA-C

## 2020-05-11 NOTE — Patient Instructions (Signed)
You have been scheduled for an endoscopy and colonoscopy. Please follow the written instructions given to you at your visit today. Please pick up your prep supplies at the pharmacy within the next 1-3 days. If you use inhalers (even only as needed), please bring them with you on the day of your procedure. Your physician has requested that you go to www.startemmi.com and enter the access code given to you at your visit today. This web site gives a general overview about your procedure. However, you should still follow specific instructions given to you by our office regarding your preparation for the procedure. _____________________________________________________ Continue pantoprazole 40 mg once daily.  _____________________________________________________  Continue follow up with your primary care provider regarding your right chest pain (pleuritic pain). I recommend a chest CT if this pain persists. _____________________________________________________  Call our office if GI symptoms worsen. ______________________________________________________  If you are age 107 or older, your body mass index should be between 23-30. Your Body mass index is 26.62 kg/m. If this is out of the aforementioned range listed, please consider follow up with your Primary Care Provider.  If you are age 61 or younger, your body mass index should be between 19-25. Your Body mass index is 26.62 kg/m. If this is out of the aformentioned range listed, please consider follow up with your Primary Care Provider.   Due to recent changes in healthcare laws, you may see the results of your imaging and laboratory studies on MyChart before your provider has had a chance to review them.  We understand that in some cases there may be results that are confusing or concerning to you. Not all laboratory results come back in the same time frame and the provider may be waiting for multiple results in order to interpret others.  Please  give [place] 48 hours in order for your provider to thoroughly review all the results before contacting the office for clarification of your results.

## 2020-05-14 NOTE — Progress Notes (Signed)
____________________________________________________________  Attending physician addendum:  Thank you for sending this case to me. I have reviewed the entire note, and the outlined plan seems appropriate.  Very good and comprehensive evaluation.  Sounds as if he has good stable respiratory function with this chronic pleuritic chest pain.  Wilfrid Lund, MD  ____________________________________________________________

## 2020-05-15 ENCOUNTER — Encounter: Payer: Self-pay | Admitting: Physician Assistant

## 2020-05-16 ENCOUNTER — Other Ambulatory Visit: Payer: Self-pay | Admitting: Physician Assistant

## 2020-05-16 DIAGNOSIS — R079 Chest pain, unspecified: Secondary | ICD-10-CM

## 2020-05-23 ENCOUNTER — Other Ambulatory Visit: Payer: Self-pay | Admitting: Emergency Medicine

## 2020-05-23 DIAGNOSIS — R079 Chest pain, unspecified: Secondary | ICD-10-CM

## 2020-06-08 ENCOUNTER — Encounter: Payer: Self-pay | Admitting: Gastroenterology

## 2020-06-13 ENCOUNTER — Other Ambulatory Visit: Payer: Self-pay

## 2020-06-13 ENCOUNTER — Encounter: Payer: Self-pay | Admitting: Gastroenterology

## 2020-06-13 ENCOUNTER — Ambulatory Visit (AMBULATORY_SURGERY_CENTER): Payer: BC Managed Care – PPO | Admitting: Gastroenterology

## 2020-06-13 VITALS — BP 109/62 | HR 54 | Temp 97.8°F | Resp 18 | Ht 67.0 in | Wt 171.0 lb

## 2020-06-13 DIAGNOSIS — R1310 Dysphagia, unspecified: Secondary | ICD-10-CM | POA: Diagnosis not present

## 2020-06-13 DIAGNOSIS — K635 Polyp of colon: Secondary | ICD-10-CM | POA: Diagnosis not present

## 2020-06-13 DIAGNOSIS — Z1211 Encounter for screening for malignant neoplasm of colon: Secondary | ICD-10-CM | POA: Diagnosis not present

## 2020-06-13 DIAGNOSIS — R12 Heartburn: Secondary | ICD-10-CM | POA: Diagnosis not present

## 2020-06-13 DIAGNOSIS — D122 Benign neoplasm of ascending colon: Secondary | ICD-10-CM

## 2020-06-13 DIAGNOSIS — R1319 Other dysphagia: Secondary | ICD-10-CM

## 2020-06-13 DIAGNOSIS — D120 Benign neoplasm of cecum: Secondary | ICD-10-CM

## 2020-06-13 HISTORY — PX: COLONOSCOPY: SHX174

## 2020-06-13 HISTORY — PX: UPPER GI ENDOSCOPY: SHX6162

## 2020-06-13 MED ORDER — SODIUM CHLORIDE 0.9 % IV SOLN: 500.0000 mL | Freq: Once | INTRAVENOUS | Status: DC

## 2020-06-13 NOTE — Op Note (Signed)
[facility] Patient Name: Sean Hernandez Procedure Date: 06/13/2020 2:40 PM MRN: [id number] Endoscopist: Mallie Mussel L. Loletha Carrow , MD Age: 49 Referring MD:  Date of Birth: [birth year] Gender: Male Account #: [id number] Procedure:                Upper GI endoscopy Indications:              Esophageal dysphagia, Heartburn (few years,                            worsening. Heartburn significantly improved on PPI                            but dysphagia persists) Medicines:                Monitored Anesthesia Care Procedure:                Pre-Anesthesia Assessment:                           - Prior to the procedure, a History and Physical                            was performed, and patient medications and                            allergies were reviewed. The patient's tolerance of                            previous anesthesia was also reviewed. The risks                            and benefits of the procedure and the sedation                            options and risks were discussed with the patient.                            All questions were answered, and informed consent                            was obtained. Prior Anticoagulants: The patient has                            taken no previous anticoagulant or antiplatelet                            agents. ASA Grade Assessment: II - A patient with                            mild systemic disease. After reviewing the risks                            and benefits, the patient was deemed in  satisfactory condition to undergo the procedure.                           After obtaining informed consent, the endoscope was                            passed under direct vision. Throughout the                            procedure, the patient's blood pressure, pulse, and                            oxygen saturations were monitored continuously. The                            Endoscope was introduced through the  mouth, and                            advanced to the second part of duodenum. The upper                            GI endoscopy was accomplished without difficulty.                            The patient tolerated the procedure well. Scope In: Scope Out: Findings:                 The larynx was normal.                           The lumen of the esophagus was dilated.                           Abnormal motility was noted at the lower esophageal                            sphincter. The cricopharyngeus was normal. The                            distal esophagus/lower esophageal sphincter is                            spastic, but gives up passage to the endoscope.                            Some motility was seen in the esophageal body.                           There is no endoscopic evidence of Barrett's                            esophagus, esophagitis or stricture in the entire                            esophagus.  The stomach was normal.                           The cardia and gastric fundus were normal on                            retroflexion.                           The examined duodenum was normal. Complications:            No immediate complications. Estimated Blood Loss:     Estimated blood loss: none. Impression:               - Normal larynx.                           - Dilation in the entire esophagus.                           - Abnormal esophageal motility, suspicious for                            achalasia.                           - Normal stomach.                           - Normal examined duodenum.                           - No specimens collected. Recommendation:           - Patient has a contact number available for                            emergencies. The signs and symptoms of potential                            delayed complications were discussed with the                            patient. Return to normal activities  tomorrow.                            Written discharge instructions were provided to the                            patient.                           - Resume previous diet.                           - Continue present medications.                           - See the other procedure note for documentation of  additional recommendations.                           - Perform a barium swallow using barium in liquid                            and tablet form at appointment to be scheduled.                           - Perform routine esophageal manometry at                            appointment to be scheduled.                           - Keep head of bed elevated to decrease nocturnal                            reflux, especially considering endoscopic findings                            and clinical suspicion of esophageal motility                            disorder. Lurie Mullane L. Loletha Carrow, MD 06/13/2020 3:46:55 PM This report has been signed electronically.

## 2020-06-13 NOTE — Patient Instructions (Addendum)
Colonoscopy:  Await pathology results of polyp removed and biopsies done    Upper Endoscopy:  An Esophageal Manometry to be sheduled by Dr Loletha Carrow' office  A Barium swallow to be scheduled by Dr Loletha Carrow' office    YOU HAD AN ENDOSCOPIC PROCEDURE TODAY AT THE [facility]:   Refer to the procedure report that was given to you for any specific questions about what was found during the examination.  If the procedure report does not answer your questions, please call your gastroenterologist to clarify.  If you requested that your care partner not be given the details of your procedure findings, then the procedure report has been included in a sealed envelope for you to review at your convenience later.  YOU SHOULD EXPECT: Some feelings of bloating in the abdomen. Passage of more gas than usual.  Walking can help get rid of the air that was put into your GI tract during the procedure and reduce the bloating. If you had a lower endoscopy (such as a colonoscopy or flexible sigmoidoscopy) you may notice spotting of blood in your stool or on the toilet paper. If you underwent a bowel prep for your procedure, you may not have a normal bowel movement for a few days.  Please Note:  You might notice some irritation and congestion in your nose or some drainage.  This is from the oxygen used during your procedure.  There is no need for concern and it should clear up in a day or so.  SYMPTOMS TO REPORT IMMEDIATELY:   Following lower endoscopy (colonoscopy or flexible sigmoidoscopy):  Excessive amounts of blood in the stool  Significant tenderness or worsening of abdominal pains  Swelling of the abdomen that is new, acute  Fever of 100F or higher   Following upper endoscopy (EGD)  Vomiting of blood or coffee ground material  New chest pain or pain under the shoulder blades  Painful or persistently difficult swallowing  New shortness of breath  Fever of 100F or higher  Black,  tarry-looking stools  For urgent or emergent issues, a gastroenterologist can be reached at any hour by calling [phone]. Do not use MyChart messaging for urgent concerns.    DIET:  We do recommend a small meal at first, but then you may proceed to your regular diet.  Drink plenty of fluids but you should avoid alcoholic beverages for 24 hours.  ACTIVITY:  You should plan to take it easy for the rest of today and you should NOT DRIVE or use heavy machinery until tomorrow (because of the sedation medicines used during the test).    FOLLOW UP: Our staff will call the number listed on your records 48-72 hours following your procedure to check on you and address any questions or concerns that you may have regarding the information given to you following your procedure. If we do not reach you, we will leave a message.  We will attempt to reach you two times.  During this call, we will ask if you have developed any symptoms of COVID 19. If you develop any symptoms (ie: fever, flu-like symptoms, shortness of breath, cough etc.) before then, please call [phone].  If you test positive for Covid 19 in the 2 weeks post procedure, please call and report this information to [place].    If any biopsies were taken you will be contacted by phone or by letter within the next 1-3 weeks.  Please call us at [phone] if you have not  heard about the biopsies in 3 weeks.

## 2020-06-13 NOTE — Progress Notes (Signed)
Called to room to assist during endoscopic procedure.  Patient ID and intended procedure confirmed with present staff. Received instructions for my participation in the procedure from the performing physician.  

## 2020-06-13 NOTE — Progress Notes (Signed)
VS by CW. ?

## 2020-06-13 NOTE — Op Note (Signed)
[place] Patient Name: Sean Hernandez Procedure Date: 06/13/2020 2:40 PM MRN: [id number] Endoscopist: Mallie Mussel L. Loletha Carrow , MD Age: 49 Referring MD:  Date of Birth: 05-08-1971 Gender: Male Account #: [id number] Procedure:                Colonoscopy Indications:              Screening for colorectal malignant neoplasm, This                            is the patient's first colonoscopy Medicines:                Monitored Anesthesia Care Procedure:                Pre-Anesthesia Assessment:                           - Prior to the procedure, a History and Physical                            was performed, and patient medications and                            allergies were reviewed. The patient's tolerance of                            previous anesthesia was also reviewed. The risks                            and benefits of the procedure and the sedation                            options and risks were discussed with the patient.                            All questions were answered, and informed consent                            was obtained. Prior Anticoagulants: The patient has                            taken no previous anticoagulant or antiplatelet                            agents. ASA Grade Assessment: II - A patient with                            mild systemic disease. After reviewing the risks                            and benefits, the patient was deemed in                            satisfactory condition to undergo the procedure.  After obtaining informed consent, the colonoscope                            was passed under direct vision. Throughout the                            procedure, the patient's blood pressure, pulse, and                            oxygen saturations were monitored continuously. The                            Colonoscope was introduced through the anus and                            advanced to the the terminal  ileum, with                            identification of the appendiceal orifice and IC                            valve. The colonoscopy was performed without                            difficulty. The patient tolerated the procedure                            well. The quality of the bowel preparation was                            excellent. The terminal ileum, ileocecal valve,                            appendiceal orifice, and rectum were photographed.                            The bowel preparation used was Plenvu. Scope In: 3:02:58 PM Scope Out: 3:23:16 PM Scope Withdrawal Time: 0 hours 17 minutes 29 seconds  Total Procedure Duration: 0 hours 20 minutes 18 seconds  Findings:                 The perianal and digital rectal examinations were                            normal.                           A large (> 4 cm) polyp was found in the cecum. The                            polyp was sessile and involving about one-third the                            circumference of the cecal base. It was adjacent to  both the ICV and AO but not entering either. A                            small area of its surface was ulcerated. Biopsies                            were taken with a cold forceps for histology.                           A 6 mm polyp was found in the ascending colon. The                            polyp was sessile. The polyp was removed with a                            cold snare. Resection and retrieval were complete.                           The exam was otherwise without abnormality on                            direct and retroflexion views. Complications:            No immediate complications. Estimated Blood Loss:     Estimated blood loss was minimal. Impression:               - One large polyp in the cecum. Biopsied.                           - One 6 mm polyp in the ascending colon, removed                            with a cold snare.  Resected and retrieved.                           - The examination was otherwise normal on direct                            and retroflexion views. Recommendation:           - Patient has a contact number available for                            emergencies. The signs and symptoms of potential                            delayed complications were discussed with the                            patient. Return to normal activities tomorrow.                            Written discharge instructions were provided to the  patient.                           - Resume previous diet.                           - Continue present medications.                           - Await pathology results. Then review case with                            advanced endoscopist regarding the feasibility of                            EMR.                           - Repeat colonoscopy is recommended for                            surveillance. The colonoscopy date will be                            determined after pathology results from today's                            exam become available for review. Tasean Mancha L. Loletha Carrow, MD 06/13/2020 3:40:55 PM This report has been signed electronically.

## 2020-06-13 NOTE — Progress Notes (Signed)
pt tolerated well. VSS. awake and to recovery. Report given to RN. Bite block inserted and removed without trauma. 

## 2020-06-15 ENCOUNTER — Other Ambulatory Visit: Payer: Self-pay

## 2020-06-15 ENCOUNTER — Telehealth: Payer: Self-pay | Admitting: *Deleted

## 2020-06-15 ENCOUNTER — Telehealth: Payer: Self-pay

## 2020-06-15 DIAGNOSIS — K219 Gastro-esophageal reflux disease without esophagitis: Secondary | ICD-10-CM

## 2020-06-15 DIAGNOSIS — R1310 Dysphagia, unspecified: Secondary | ICD-10-CM

## 2020-06-15 NOTE — Telephone Encounter (Signed)
Attempted to reach pt. With follow-up call following endoscopic procedure 06/13/2020.  LM on pt. Voice mail.  Will try to reach pt. Again later today.

## 2020-06-15 NOTE — Telephone Encounter (Signed)
  Follow up Call-  Call back number 06/13/2020  Post procedure Call Back phone  # [phone]  Permission to leave phone message Yes  Some recent data might be hidden     Patient questions:  Do you have a fever, pain , or abdominal swelling? No. Pain Score  0 *  Have you tolerated food without any problems? Yes.    Have you been able to return to your normal activities? Yes.    Do you have any questions about your discharge instructions: Diet   No. Medications  No. Follow up visit  No.  Do you have questions or concerns about your Care? No.  Actions: * If pain score is 4 or above: No action needed, pain <4.  1. Have you developed a fever since your procedure? no  2.   Have you had an respiratory symptoms (SOB or cough) since your procedure? no  3.   Have you tested positive for COVID 19 since your procedure no  4.   Have you had any family members/close contacts diagnosed with the COVID 19 since your procedure?  no   If yes to any of these questions please route to Joylene John, RN and Erenest Rasher, RN

## 2020-06-15 NOTE — Progress Notes (Signed)
Pt scheduled for barium swallow at [facility] 06/21/20@11am , pt to arrive there at 10:45am. Pt to be NPO after 7am.  Pt scheduled for covid test 08/28/20@9am , EM scheduled at [facility] 08/31/20@8 :30am. Pt aware of appts and instructions sent to pt.

## 2020-06-21 ENCOUNTER — Other Ambulatory Visit: Payer: Self-pay

## 2020-06-21 ENCOUNTER — Ambulatory Visit (HOSPITAL_COMMUNITY)
Admission: RE | Admit: 2020-06-21 | Discharge: 2020-06-21 | Disposition: A | Discharge status: Home / Self Care | Payer: BC Managed Care – PPO | Source: Ambulatory Visit | Attending: Gastroenterology | Admitting: Gastroenterology

## 2020-06-21 DIAGNOSIS — K219 Gastro-esophageal reflux disease without esophagitis: Secondary | ICD-10-CM

## 2020-06-21 DIAGNOSIS — K222 Esophageal obstruction: Secondary | ICD-10-CM | POA: Diagnosis not present

## 2020-06-21 IMAGING — RF DG ESOPHAGUS
8 of 10 series · 17 of 24 positions shown · non-contrast
Comparison: Endoscopy report of [DATE]

CLINICAL DATA: Dysphagia with solids and liquids. Recent endoscopy
demonstrating esophageal dilatation and abnormal motility suspicious
for achalasia. Spastic lower esophageal sphincter.

EXAM:
ESOPHOGRAM/BARIUM SWALLOW
TECHNIQUE: Single contrast examination was performed using  thin barium.
FLUOROSCOPY TIME:  Fluoroscopy Time:  4 minutes and 24 seconds
Radiation Exposure Index (if provided by the fluoroscopic device):
33.5 mGy
Number of Acquired Spot Images: 0

[Series 1: cp_standard · 0.53mm/px · 2 of 320 frames shown (1 of 8)]
[frame 49/320]
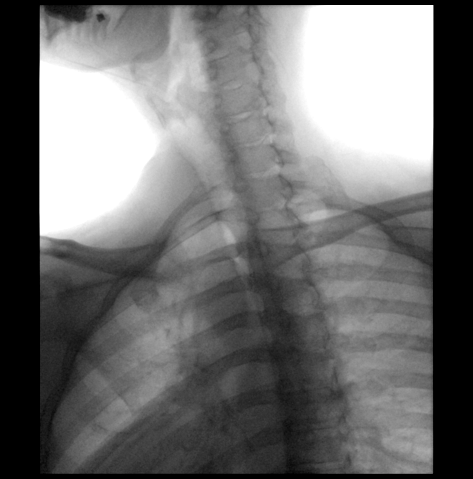
[frame 273/320]
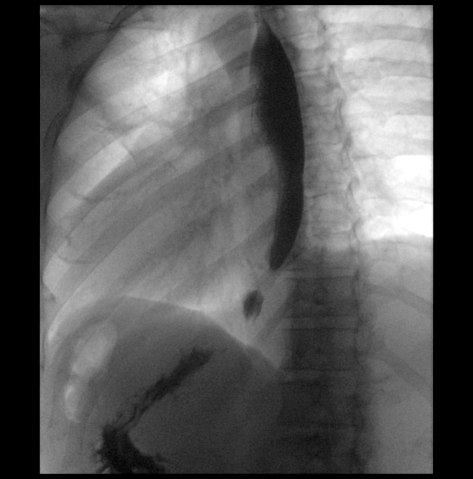

[Series 2: cp_standard · 0.54mm/px · 2 of 331 frames shown (2 of 8)]
[frame 50/331]
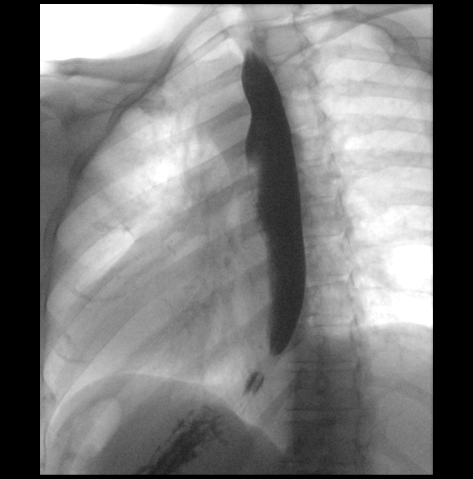
[frame 166/331]
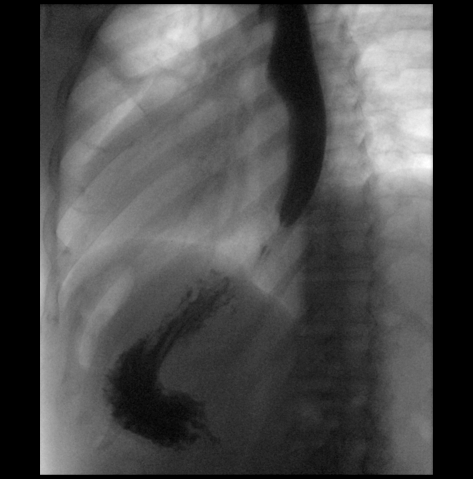

[Series 3: cp_standard · 0.54mm/px · 2 of 252 frames shown (3 of 8)]
[frame 38/252]
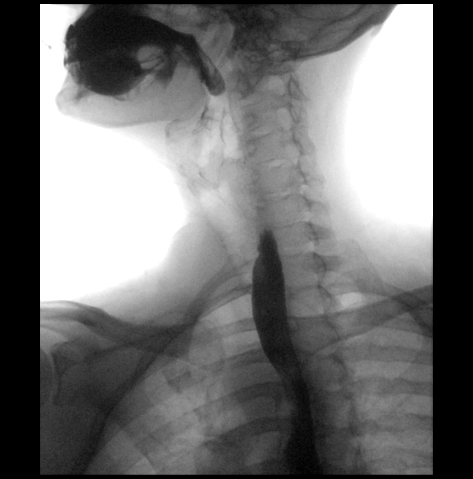
[frame 127/252]
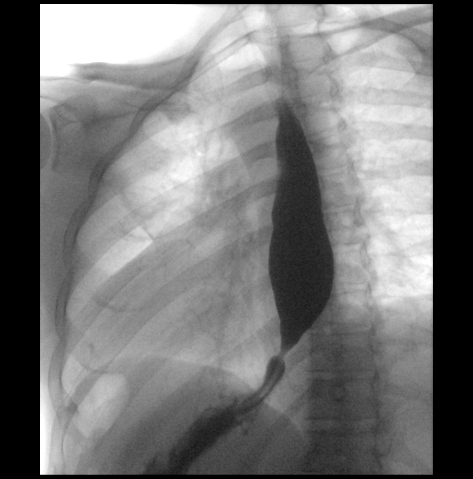

[Series 4: cp_standard · 0.54mm/px · 2 of 437 frames shown (4 of 8)]
[frame 66/437]
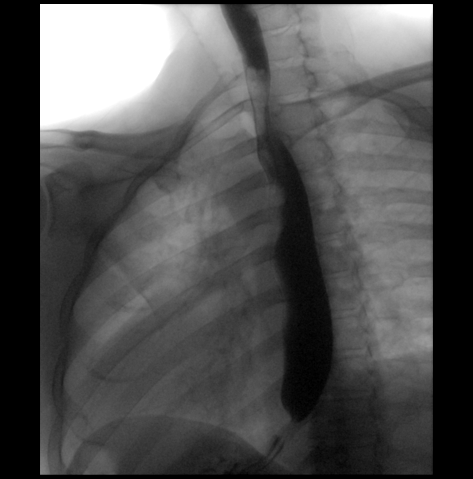
[frame 202/437]
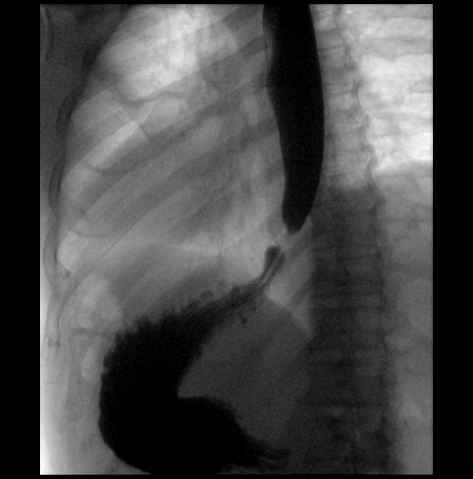

[Series 5: cp_standard · 0.54mm/px · 3 of 290 frames shown (5 of 8)]
[frame 44/290]
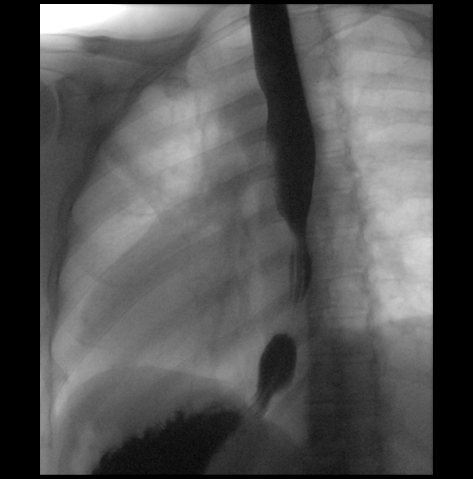
[frame 247/290]
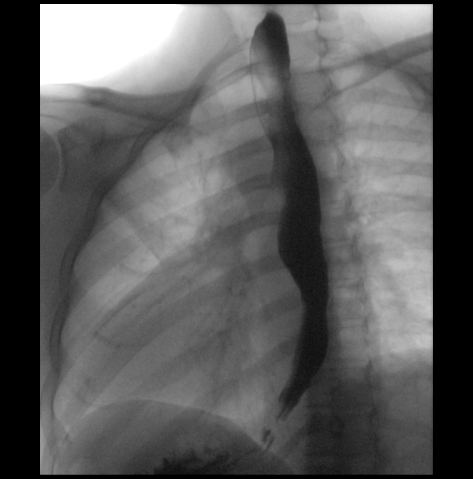
[frame 268/290]
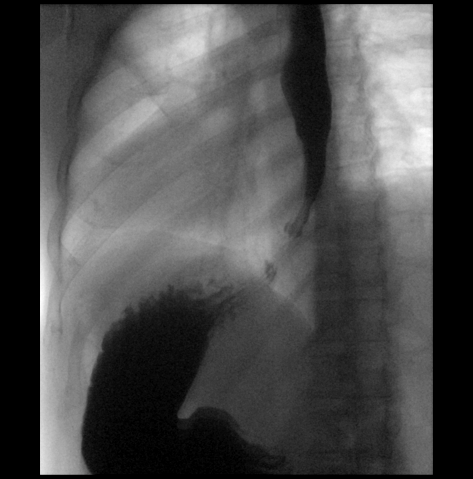

[Series 6: cp_standard · 0.54mm/px · 2 of 180 frames shown (6 of 8)]
[frame 91/180]
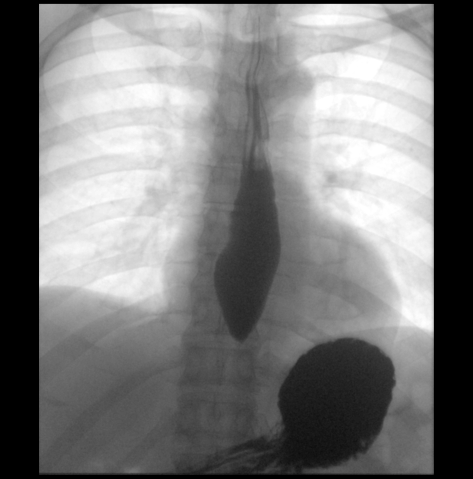
[frame 154/180]
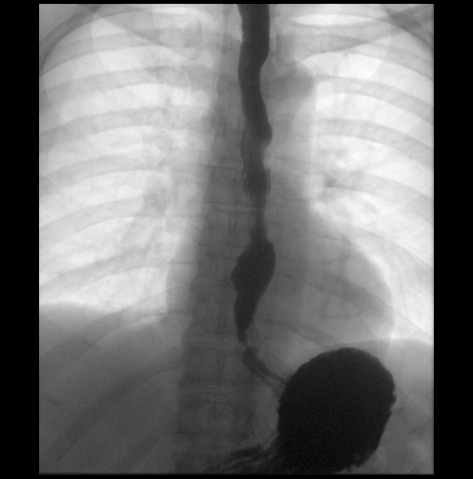

[Series 8: cp_standard · 0.54mm/px · 3 of 131 frames shown (7 of 8)]
[frame 20/131]
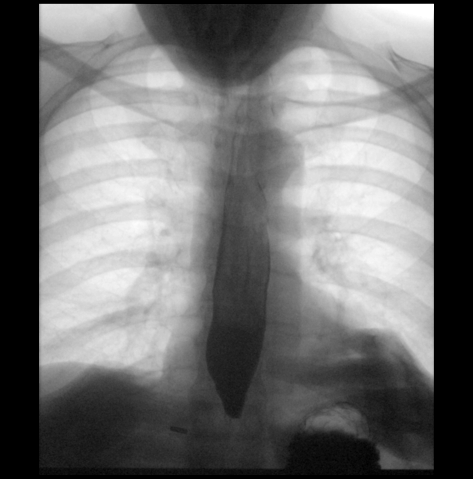
[frame 66/131]
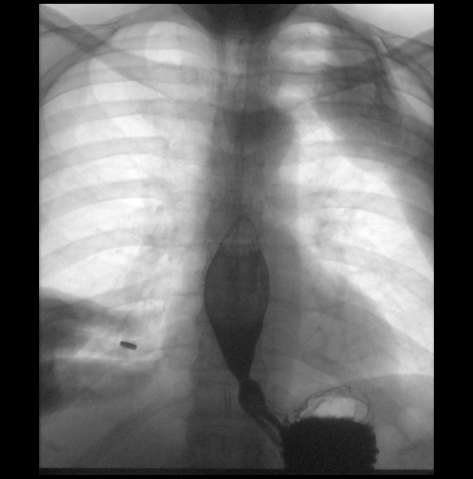
[frame 112/131]
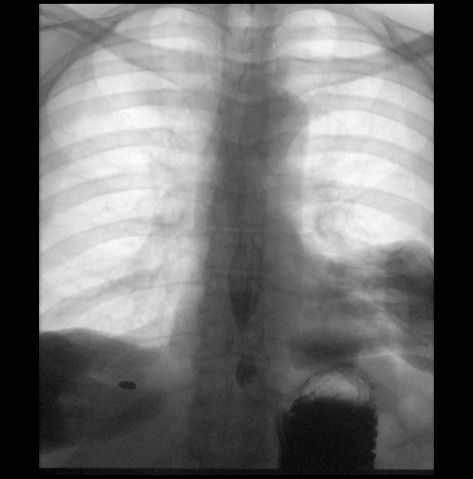

[Series 11: cp_standard · 0.27mm/px · 1 of 1 slices shown (8 of 8)]
[im 1/1]
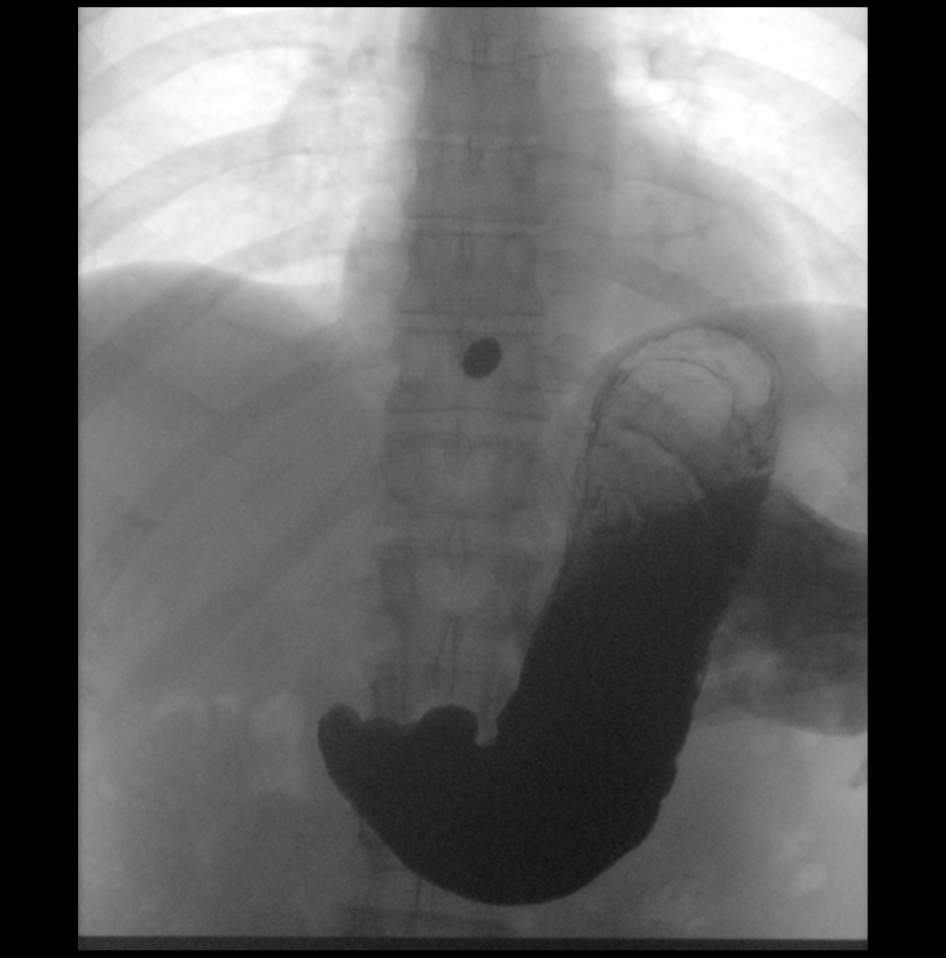

[17 of 24 positions shown; findings below may reference images not displayed]

FINDINGS: Evaluation of esophageal motility demonstrates a primary esophageal
wave within the upper and mid esophagus, most apparent on the first
series. Somewhat decreased primary wave in the distal esophagus with
proximal escape waves, contrast stasis throughout the upper and mid
esophagus.

Full column evaluation of the esophagus demonstrates a small hiatal
hernia with an area of moderate persistent narrowing at the
gastroesophageal junction, including on image 231 of series 4.

There may be minimal distal esophageal dilatation.

With water swallows in upright position, the contrast clears the
esophagus, including on series 8.

A 13 mm barium tablet has persistent delayed passage at the level of
the distal esophagus.
IMPRESSION: 1. No typical findings of advanced achalasia. Suboptimal primary
peristaltic wave within the distal esophagus with small hiatal
hernia and moderate narrowing at the gastroesophageal junction.
Findings could be related to early presbyesophagus with secondary
gastroesophageal junction peptic induced stricture. Very early or
mild achalasia could look similar, especially given the endoscopy
findings.
2. Obstruction of 13 mm barium tablet at the gastroesophageal
junction, secondary to above.

## 2020-06-22 ENCOUNTER — Telehealth: Payer: Self-pay | Admitting: Gastroenterology

## 2020-06-22 ENCOUNTER — Other Ambulatory Visit: Payer: Self-pay

## 2020-06-22 DIAGNOSIS — K635 Polyp of colon: Secondary | ICD-10-CM

## 2020-06-22 NOTE — Telephone Encounter (Signed)
See result note.  

## 2020-06-28 ENCOUNTER — Other Ambulatory Visit: Payer: Self-pay

## 2020-06-28 DIAGNOSIS — K635 Polyp of colon: Secondary | ICD-10-CM

## 2020-06-29 ENCOUNTER — Telehealth: Payer: Self-pay | Admitting: Gastroenterology

## 2020-06-29 NOTE — Telephone Encounter (Signed)
The pt has been advised that the CT scan has been changed to without IV contrast.  Scheduling has been made aware and made the changes.

## 2020-06-29 NOTE — Telephone Encounter (Signed)
Dr Loletha Carrow this pt is scheduled for a CT with and without contrast however he is allergic to contrast.  Do you want to change it to without IV dye?

## 2020-06-29 NOTE — Telephone Encounter (Signed)
My apologies, yes I see that now listed under his allergies.  Appreciate him letting us know.  It will have to be with oral contrast alone.  - HD

## 2020-06-29 NOTE — Telephone Encounter (Signed)
This is Dr Corena Pilgrim patient.  I will send to [facility].

## 2020-07-03 ENCOUNTER — Other Ambulatory Visit: Payer: Self-pay

## 2020-07-03 ENCOUNTER — Ambulatory Visit (HOSPITAL_COMMUNITY)
Admission: RE | Admit: 2020-07-03 | Discharge: 2020-07-03 | Disposition: A | Discharge status: Home / Self Care | Payer: BC Managed Care – PPO | Source: Ambulatory Visit | Attending: Gastroenterology | Admitting: Gastroenterology

## 2020-07-03 ENCOUNTER — Encounter (HOSPITAL_COMMUNITY): Payer: Self-pay

## 2020-07-03 DIAGNOSIS — R590 Localized enlarged lymph nodes: Secondary | ICD-10-CM | POA: Diagnosis not present

## 2020-07-03 DIAGNOSIS — K635 Polyp of colon: Secondary | ICD-10-CM | POA: Insufficient documentation

## 2020-07-03 DIAGNOSIS — Z86010 Personal history of colonic polyps: Secondary | ICD-10-CM | POA: Diagnosis not present

## 2020-07-03 IMAGING — CT CT ABD-PELV W/O CM
2 of 4 series · 16 of 46 positions shown, 18 images · non-contrast
Comparison: None.

CLINICAL DATA: Large colonic polyp in cecum diagnosed by recent
colonoscopy.

EXAM:
CT ABDOMEN AND PELVIS WITHOUT CONTRAST
TECHNIQUE: Multidetector CT imaging of the abdomen and pelvis was performed
following the standard protocol without IV contrast.

[Series 2: axial st · axial · 0.75mm/px · z∈[-586,-136]mm · 13 of 102 slices shown, 15 images]
[im 6/102  soft-tissue]
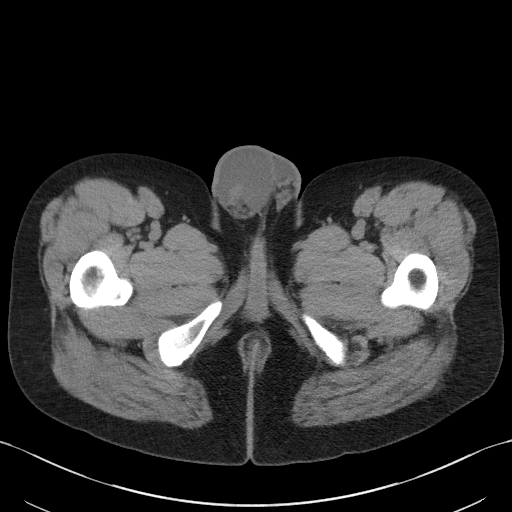
[im 6/102  bone]
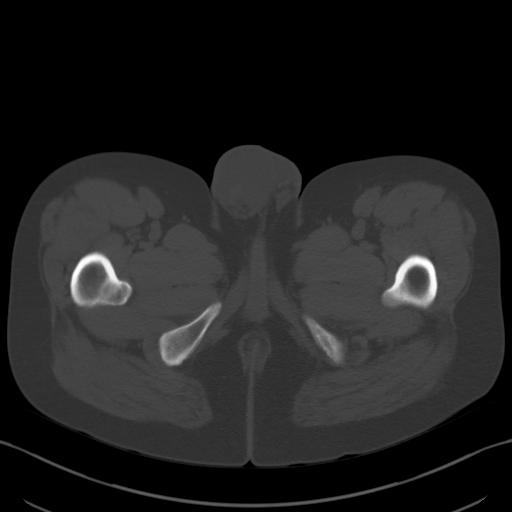
[im 12/102  soft-tissue]
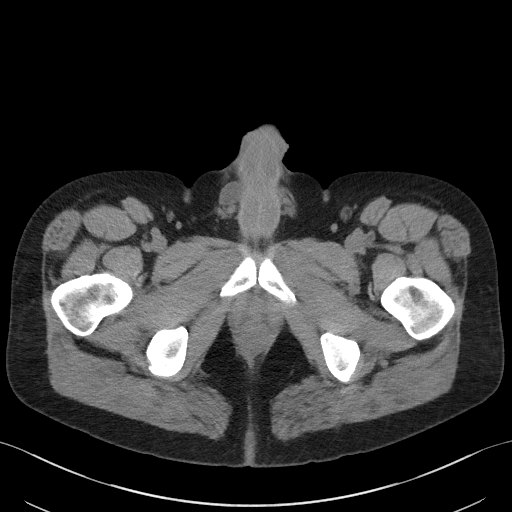
[im 24/102  soft-tissue]
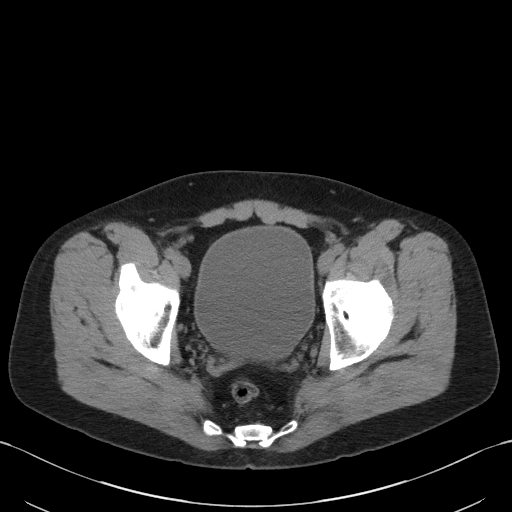
[im 30/102  soft-tissue]
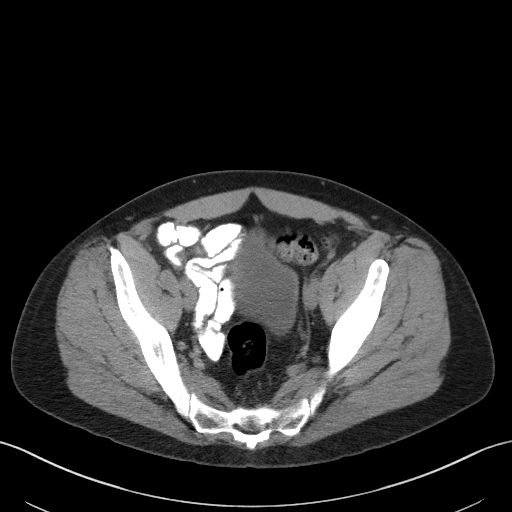
[im 36/102  soft-tissue]
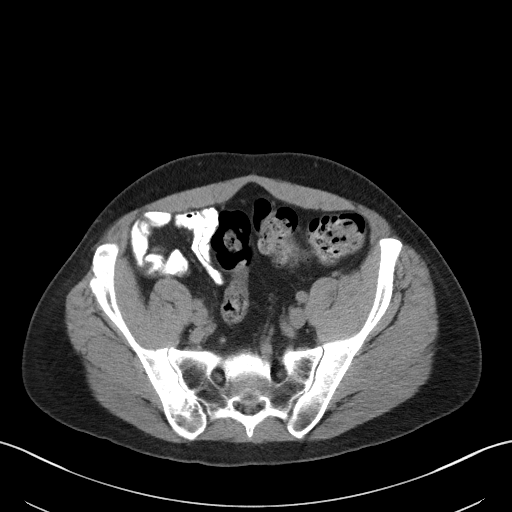
[im 42/102  soft-tissue]
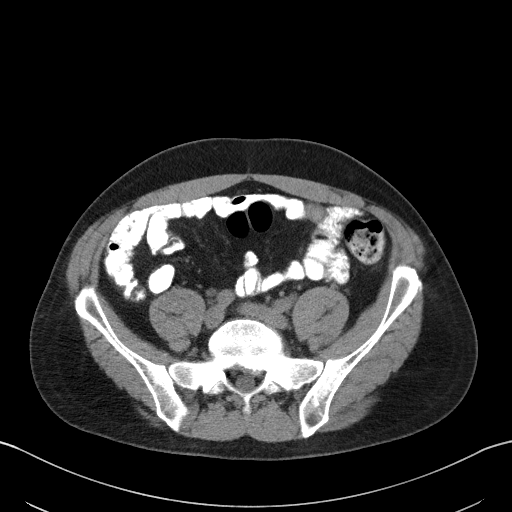
[im 54/102  soft-tissue]
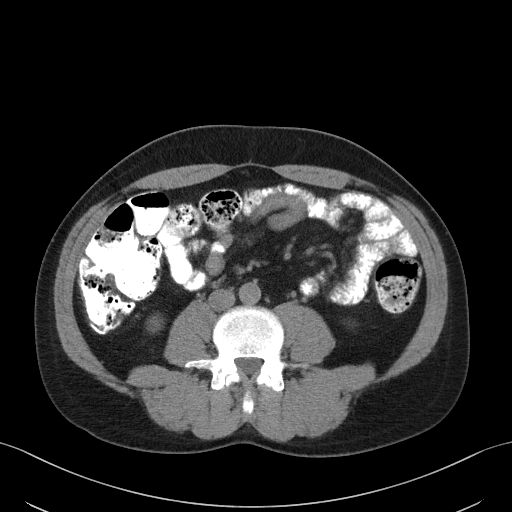
[im 60/102  soft-tissue]
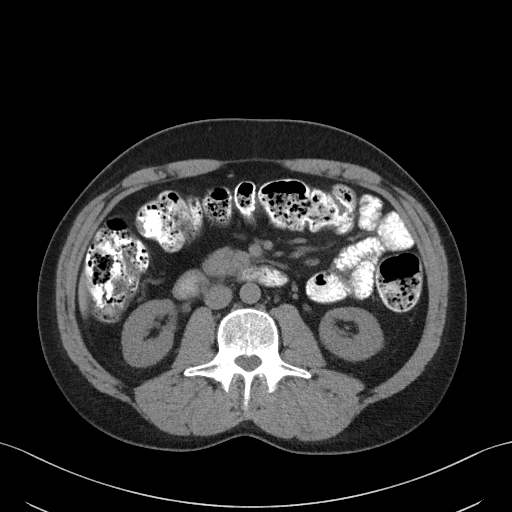
[im 66/102  soft-tissue]
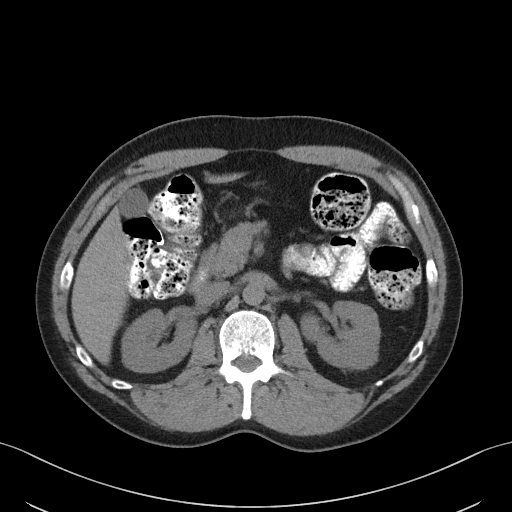
[im 66/102  bone]
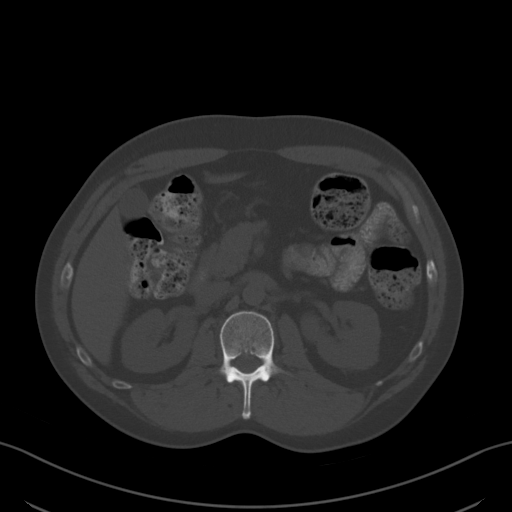
[im 72/102  soft-tissue]
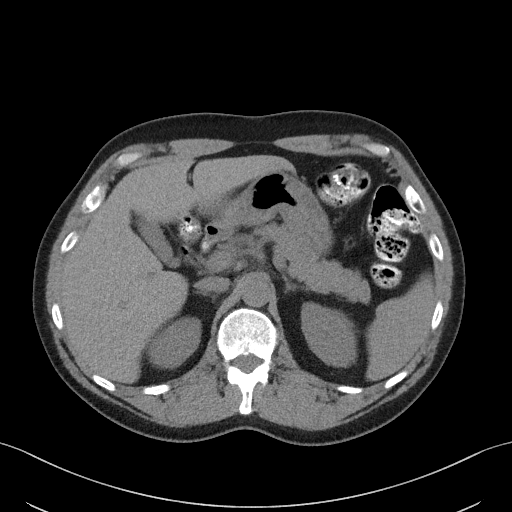
[im 78/102  soft-tissue]
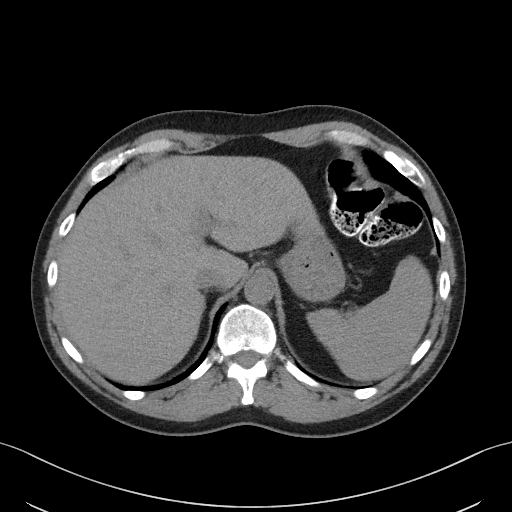
[im 90/102  soft-tissue]
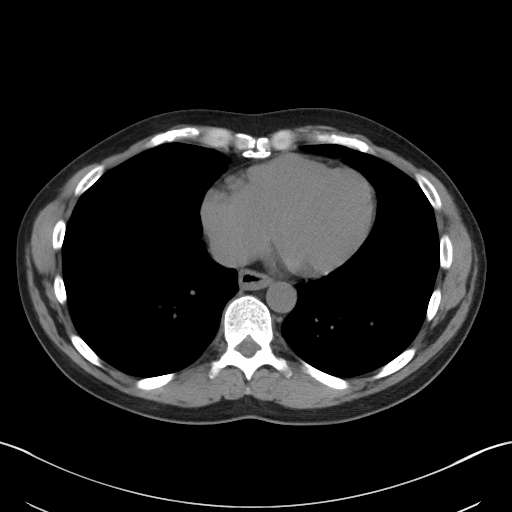
[im 96/102  soft-tissue]
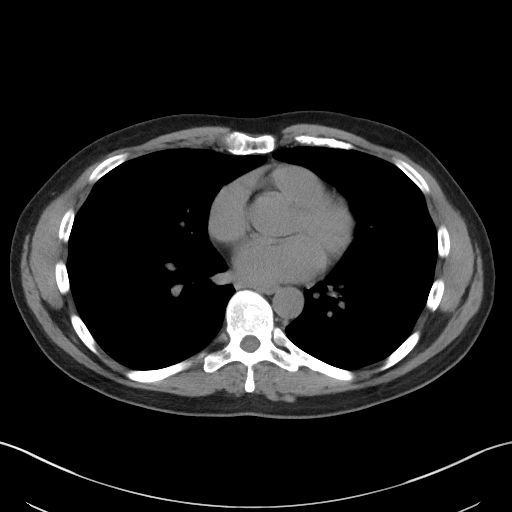

[Series 4: coronal st · coronal · 0.79mm/px · 3 of 94 slices shown]
[im 32/94  soft-tissue]
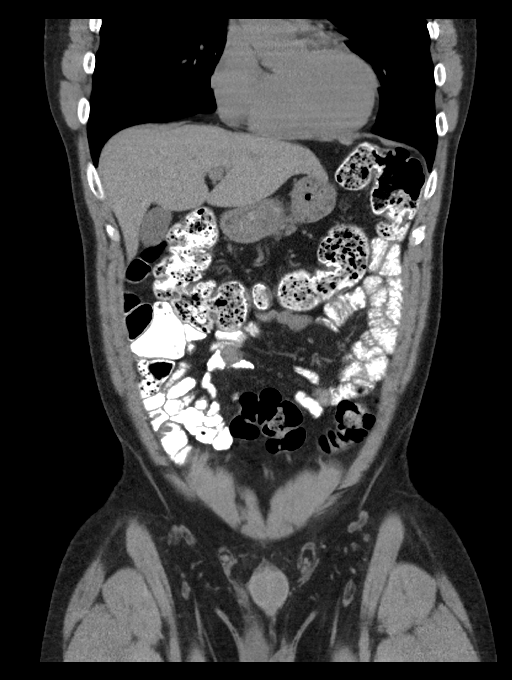
[im 42/94  soft-tissue]
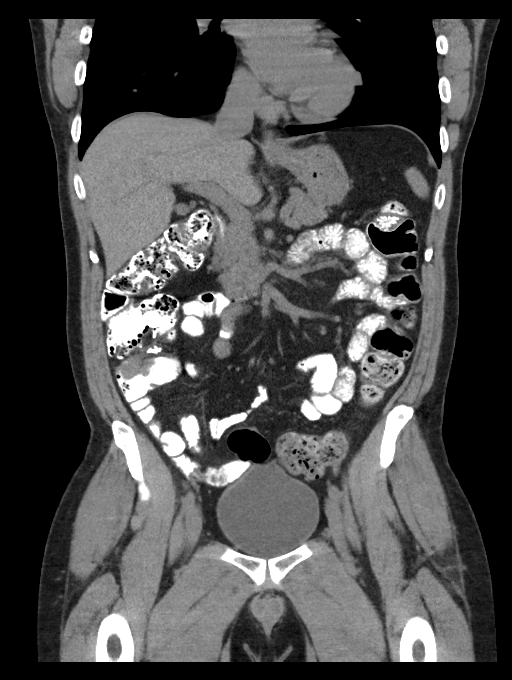
[im 52/94  soft-tissue]
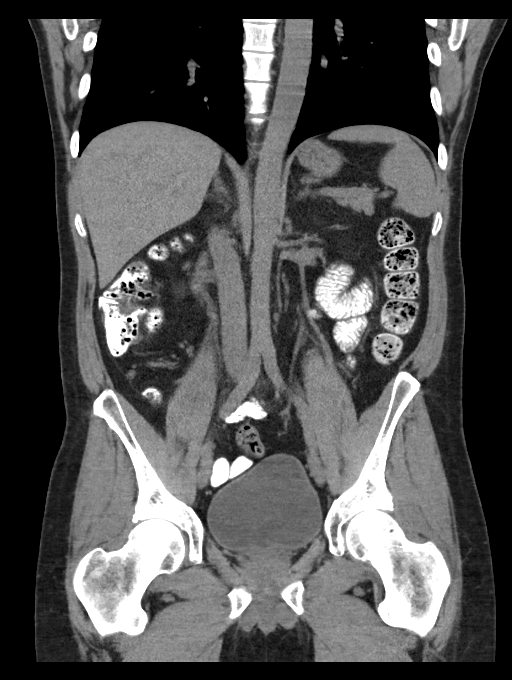

[16 of 46 positions shown; findings below may reference images not displayed]

FINDINGS: Lower chest: No acute findings.

Hepatobiliary: No mass visualized on this unenhanced exam.
Gallbladder is unremarkable. No evidence of biliary ductal
dilatation.

Pancreas: No mass or inflammatory process visualized on this
unenhanced exam.

Spleen:  Within normal limits in size.

Adrenals/Urinary tract: No evidence of urolithiasis or
hydronephrosis. Unremarkable unopacified urinary bladder.

Stomach/Bowel: A lobulated intraluminal soft tissue mass is seen in
the cecum just proximal to the ileocecal valve, which measures 3.7 x
3.0 cm. There is no evidence of bowel obstruction. No evidence of
inflammatory process or abnormal fluid collections.

Vascular/Lymphatic: Several tiny less than 1 cm right-sided
pericolonic lymph nodes are seen, but no pathologically enlarged
lymph nodes identified. No evidence of abdominal aortic aneurysm.

Reproductive:  No mass or other significant abnormality.

Other:  None.

Musculoskeletal:  No suspicious bone lesions identified.
IMPRESSION: 3.7 cm intraluminal soft tissue mass in the cecum just proximal to
the ileocecal valve, highly suspicious for colon carcinoma.

Several tiny less than 1 cm right-sided pericolonic lymph nodes,
which are nonspecific. No pathologically enlarged lymph nodes
identified, however early lymph node metastases cannot be excluded.

No evidence of distant metastatic disease.

## 2020-07-23 ENCOUNTER — Other Ambulatory Visit: Payer: Self-pay | Admitting: Physician Assistant

## 2020-07-23 DIAGNOSIS — K222 Esophageal obstruction: Secondary | ICD-10-CM

## 2020-07-23 DIAGNOSIS — K2100 Gastro-esophageal reflux disease with esophagitis, without bleeding: Secondary | ICD-10-CM

## 2020-07-23 DIAGNOSIS — R0789 Other chest pain: Secondary | ICD-10-CM

## 2020-07-23 MED ORDER — PANTOPRAZOLE SODIUM 40 MG PO TBEC: 40.0000 mg | DELAYED_RELEASE_TABLET | Freq: Every day | ORAL | 1 refills | Status: DC

## 2020-07-24 ENCOUNTER — Encounter: Payer: Self-pay | Admitting: Pulmonary Disease

## 2020-07-24 ENCOUNTER — Ambulatory Visit (INDEPENDENT_AMBULATORY_CARE_PROVIDER_SITE_OTHER): Payer: BC Managed Care – PPO | Admitting: Pulmonary Disease

## 2020-07-24 ENCOUNTER — Other Ambulatory Visit: Payer: Self-pay

## 2020-07-24 VITALS — BP 110/70 | HR 66 | Temp 97.3°F | Ht 67.5 in | Wt 169.1 lb

## 2020-07-24 DIAGNOSIS — R079 Chest pain, unspecified: Secondary | ICD-10-CM | POA: Diagnosis not present

## 2020-07-24 NOTE — Patient Instructions (Signed)
I have reviewed your history Your symptoms are suggestive of musculoskeletal pain I am glad that it is improving overall  We can consider a blood test for clots or CT chest without contrast if it recurs or worsens You can return to clinic as needed.  Please call us with any questions.

## 2020-07-24 NOTE — Progress Notes (Signed)
Sean Hernandez    [id number]    Jan 02, 1971  Primary Care Physician:Martin, Luanna Cole, PA-C  Referring Physician: Brunetta Jeans, PA-C [address] [place],  [place] [id number]  Chief complaint:  Consult for chest pain  HPI: 49 year old with history of GERD, esophageal dysmotility Complains of right upper anterior chest pain for the past 4 months.  Pain occurs on movement of the and on deep inspiration.  Patient recalls moving some furniture at home prior to this Overall he has been taking ibuprofen and symptoms improving. Denies any dyspnea No family history of clots, no immobility  Being evaluated by GI for esophageal dysmotility.  On PPI for GERD  Pets: Dogs Occupation: Art gallery manager Exposures: No known exposures.  No mold, hot tub, Jacuzzi Smoking history: Minimal smoking in college Travel history: No significant travel history Relevant family history: No significant family issue of lung disease or clots.   Outpatient Encounter Medications as of 07/24/2020  Medication Sig  . cetirizine (ZYRTEC) 10 MG tablet Take 10 mg by mouth daily.  . pantoprazole (PROTONIX) 40 MG tablet Take 1 tablet (40 mg total) by mouth daily.   No facility-administered encounter medications on file as of 07/24/2020.    Allergies as of 07/24/2020 - Review Complete 07/24/2020  Allergen Reaction Noted  . Iodinated diagnostic agents Other (See Comments) 11/24/2016    Past Medical History:  Diagnosis Date  . Allergy   . GERD (gastroesophageal reflux disease)   . Osteoid osteoma     Past Surgical History:  Procedure Laterality Date  . DENTAL SURGERY    . NASAL RECONSTRUCTION    . OSTEOTOMY FIBULA     osteoasteoma    Family History  Problem Relation Age of Onset  . Dementia Mother 61       Alzheimer's  . Colon polyps Mother   . Hypertension Mother   . Hypertension Father   . COPD Father   . Autism Daughter   . Depression Daughter   . Lung cancer Paternal  Grandfather        Smoker  . Dementia Maternal Uncle        Alzheimer's  . Lung cancer Paternal Uncle        w/ metastatsis; Unsure of smoking status  . Colon cancer Neg Hx   . Esophageal cancer Neg Hx   . Rectal cancer Neg Hx   . Stomach cancer Neg Hx     Social History   Socioeconomic History  . Marital status: Married    Spouse name: Not on file  . Number of children: Not on file  . Years of education: Not on file  . Highest education level: Not on file  Occupational History  . Not on file  Tobacco Use  . Smoking status: Never Smoker  . Smokeless tobacco: Never Used  Vaping Use  . Vaping Use: Never used  Substance and Sexual Activity  . Alcohol use: Yes    Alcohol/week: 3.0 standard drinks    Types: 1 Glasses of wine, 1 Cans of beer, 1 Shots of liquor per week    Comment: ocassionally  . Drug use: Never  . Sexual activity: Yes  Other Topics Concern  . Not on file  Social History Narrative  . Not on file   Social Determinants of Health   Financial Resource Strain:   . Difficulty of Paying Living Expenses: Not on file  Food Insecurity:   . Worried About  Running Out of Food in the Last Year: Not on file  . Ran Out of Food in the Last Year: Not on file  Transportation Needs:   . Lack of Transportation (Medical): Not on file  . Lack of Transportation (Non-Medical): Not on file  Physical Activity:   . Days of Exercise per Week: Not on file  . Minutes of Exercise per Session: Not on file  Stress:   . Feeling of Stress : Not on file  Social Connections:   . Frequency of Communication with Friends and Family: Not on file  . Frequency of Social Gatherings with Friends and Family: Not on file  . Attends Religious Services: Not on file  . Active Member of Clubs or Organizations: Not on file  . Attends Archivist Meetings: Not on file  . Marital Status: Not on file  Intimate Partner Violence:   . Fear of Current or Ex-Partner: Not on file  . Emotionally  Abused: Not on file  . Physically Abused: Not on file  . Sexually Abused: Not on file    Review of systems: Review of Systems  Constitutional: Negative for fever and chills.  HENT: Negative.   Eyes: Negative for blurred vision.  Respiratory: as per HPI  Cardiovascular: Negative for chest pain and palpitations.  Gastrointestinal: Negative for vomiting, diarrhea, blood per rectum. Genitourinary: Negative for dysuria, urgency, frequency and hematuria.  Musculoskeletal: Negative for myalgias, back pain and joint pain.  Skin: Negative for itching and rash.  Neurological: Negative for dizziness, tremors, focal weakness, seizures and loss of consciousness.  Endo/Heme/Allergies: Negative for environmental allergies.  Psychiatric/Behavioral: Negative for depression, suicidal ideas and hallucinations.  All other systems reviewed and are negative.  Physical Exam: Blood pressure 110/70, pulse 66, temperature (!) 97.3 F (36.3 C), temperature source Oral, height 5' 7.5" (1.715 m), weight 169 lb 1.6 oz (76.7 kg), SpO2 97 %. Gen:      No acute distress HEENT:  EOMI, sclera anicteric Neck:     No masses; no thyromegaly Lungs:    Clear to auscultation bilaterally; normal respiratory effort CV:         Regular rate and rhythm; no murmurs Abd:      + bowel sounds; soft, non-tender; no palpable masses, no distension Ext:    No edema; adequate peripheral perfusion Skin:      Warm and dry; no rash Neuro: alert and oriented x 3 Psych: normal mood and affect  Data Reviewed: Imaging: Chest x-ray 04/06/2020-lungs are clear with no acute cardiopulmonary abnormality   Assessment:  Atypical chest pain Appears musculoskeletal Overall improving with over-the-counter NSAIDs  Suspicion for any lung abnormality or VTE is low Discussed D-dimer testing and CT chest without contrast (Patient has contrast allergy)  After informed decision making and since his symptoms are improving patient has deferred  further testing He will call us back if he changes his mind regarding the above tests  Return to clinic as needed.  Plan/Recommendations: Return to clinic as needed  Marshell Garfinkel MD [facility] 07/24/2020, 9:38 AM  CC: Brunetta Jeans, PA-C

## 2020-08-10 ENCOUNTER — Other Ambulatory Visit (INDEPENDENT_AMBULATORY_CARE_PROVIDER_SITE_OTHER): Payer: BC Managed Care – PPO

## 2020-08-10 ENCOUNTER — Ambulatory Visit (INDEPENDENT_AMBULATORY_CARE_PROVIDER_SITE_OTHER): Payer: BC Managed Care – PPO | Admitting: Gastroenterology

## 2020-08-10 ENCOUNTER — Encounter: Payer: Self-pay | Admitting: Gastroenterology

## 2020-08-10 VITALS — BP 106/70 | HR 72 | Ht 67.0 in | Wt 169.0 lb

## 2020-08-10 DIAGNOSIS — Z8371 Family history of colonic polyps: Secondary | ICD-10-CM

## 2020-08-10 DIAGNOSIS — Z800 Family history of malignant neoplasm of digestive organs: Secondary | ICD-10-CM

## 2020-08-10 DIAGNOSIS — Z86010 Personal history of colonic polyps: Secondary | ICD-10-CM

## 2020-08-10 DIAGNOSIS — D126 Benign neoplasm of colon, unspecified: Secondary | ICD-10-CM

## 2020-08-10 DIAGNOSIS — K635 Polyp of colon: Secondary | ICD-10-CM

## 2020-08-10 DIAGNOSIS — R933 Abnormal findings on diagnostic imaging of other parts of digestive tract: Secondary | ICD-10-CM

## 2020-08-10 LAB — BASIC METABOLIC PANEL
BUN: 15 mg/dL (ref 6–23)
CO2: 29 mEq/L (ref 19–32)
Calcium: 9.3 mg/dL (ref 8.4–10.5)
Chloride: 101 mEq/L (ref 96–112)
Creatinine, Ser: 1.03 mg/dL (ref 0.40–1.50)
GFR: 76.19 mL/min (ref >60.00)
Glucose, Bld: 90 mg/dL (ref 70–99)
Potassium: 4.6 mEq/L (ref 3.5–5.1)
Sodium: 135 mEq/L (ref 135–145)

## 2020-08-10 LAB — CBC
HCT: 44.3 % (ref 39.0–52.0)
Hemoglobin: 15.5 g/dL (ref 13.0–17.0)
MCHC: 35 g/dL (ref 30.0–36.0)
MCV: 90.3 fl (ref 78.0–100.0)
Platelets: 283 10*3/uL (ref 150.0–400.0)
RBC: 4.9 Mil/uL (ref 4.22–5.81)
RDW: 13.8 % (ref 11.5–15.5)
WBC: 7.8 10*3/uL (ref 4.0–10.5)

## 2020-08-10 LAB — CEA: CEA: 1.1 ng/mL

## 2020-08-10 LAB — PROTIME-INR
INR: 1.1 ratio — ABNORMAL HIGH (ref 0.8–1.0)
Prothrombin Time: 12.1 s (ref 9.6–13.1)

## 2020-08-10 NOTE — Progress Notes (Signed)
[place] VISIT   Primary Care Provider Brunetta Jeans, PA-C [address] [place] [address] [phone]  Referring Provider Dr. Loletha Carrow  Patient Profile: Sean Hernandez is a 49 y.o. male with a pmh significant for allergies, GERD, colon polyps (large cecal TVA with HGD).  The patient presents to the [facility] for an evaluation and management of problem(s) noted below:  Problem List 1. Tubulovillous adenoma of colon   2. Cecal polyp   3. History of colon polyps   4. Family history of colonic polyps   5. Abnormal colonoscopy     History of Present Illness Please see initial consultation note and progress notes by NP [facility] and Dr. Loletha Carrow for full details of HPI.  Interval History The patient recently underwent a screening colonoscopy (his first).  He was found to have a large cecal polypoid mass.  Biopsies consistent with tubulovillous adenomatous change with high-grade dysplasia.  It is for this reason that the patient was referred for consideration of advanced polyp resection.  Patient underwent a CT scan with results as below.  Biopsies, once again had not shown any evidence of malignancy though deeper invasiveness had not been completely discerned.  Patient describes no particular GI symptoms of melena or hematochezia or significant abdominal pains.  His biggest issues have been GERD related as well as prior pleuritic chest pain.  Patient does not take any significant nonsteroidals or BC/Goody powders.  There is no family history of colon cancer but his mother and father have a history of colon polyps.  GI Review of Systems Positive as above Negative for odynophagia, nausea, vomiting, pain  Review of Systems General: Denies fevers/chills/weight loss unintentionally HEENT: Denies oral lesions Cardiovascular: Denies chest pain Pulmonary: Denies shortness of breath Gastroenterological: See HPI Genitourinary:  Denies darkened urine Hematological: Denies easy bruising/bleeding Dermatological: Denies jaundice Psychological: Mood is stable though anxious to hopefully have this taken care of endoscopically and minimize risk of surgery if possible   Medications Current Outpatient Medications  Medication Sig Dispense Refill  . cetirizine (ZYRTEC) 10 MG tablet Take 10 mg by mouth daily.    . pantoprazole (PROTONIX) 40 MG tablet Take 1 tablet (40 mg total) by mouth daily. 90 tablet 1   No current facility-administered medications for this visit.    Allergies Allergies  Allergen Reactions  . Iodinated Diagnostic Agents Other (See Comments)    Jittery, dizziness, palpitations    Histories Past Medical History:  Diagnosis Date  . Allergy   . GERD (gastroesophageal reflux disease)   . Osteoid osteoma    Past Surgical History:  Procedure Laterality Date  . DENTAL SURGERY    . NASAL RECONSTRUCTION    . OSTEOTOMY FIBULA     osteoasteoma   Social History   Socioeconomic History  . Marital status: Married    Spouse name: Not on file  . Number of children: Not on file  . Years of education: Not on file  . Highest education level: Not on file  Occupational History  . Not on file  Tobacco Use  . Smoking status: Never Smoker  . Smokeless tobacco: Never Used  Vaping Use  . Vaping Use: Never used  Substance and Sexual Activity  . Alcohol use: Yes    Alcohol/week: 3.0 standard drinks    Types: 1 Glasses of wine, 1 Cans of beer, 1 Shots of liquor per week    Comment: ocassionally  . Drug use: Never  . Sexual  activity: Yes  Other Topics Concern  . Not on file  Social History Narrative  . Not on file   Social Determinants of Health   Financial Resource Strain:   . Difficulty of Paying Living Expenses: Not on file  Food Insecurity:   . Worried About Charity fundraiser in the Last Year: Not on file  . Ran Out of Food in the Last Year: Not on file  Transportation Needs:   . Lack  of Transportation (Medical): Not on file  . Lack of Transportation (Non-Medical): Not on file  Physical Activity:   . Days of Exercise per Week: Not on file  . Minutes of Exercise per Session: Not on file  Stress:   . Feeling of Stress : Not on file  Social Connections:   . Frequency of Communication with Friends and Family: Not on file  . Frequency of Social Gatherings with Friends and Family: Not on file  . Attends Religious Services: Not on file  . Active Member of Clubs or Organizations: Not on file  . Attends Archivist Meetings: Not on file  . Marital Status: Not on file  Intimate Partner Violence:   . Fear of Current or Ex-Partner: Not on file  . Emotionally Abused: Not on file  . Physically Abused: Not on file  . Sexually Abused: Not on file   Family History  Problem Relation Age of Onset  . Dementia Mother 96       Alzheimer's  . Colon polyps Mother   . Hypertension Mother   . Hypertension Father   . COPD Father   . Colon polyps Father   . Autism Daughter   . Depression Daughter   . Lung cancer Paternal Grandfather        Smoker  . Dementia Maternal Uncle        Alzheimer's  . Lung cancer Paternal Uncle        w/ metastatsis; Unsure of smoking status  . Colon cancer Neg Hx   . Esophageal cancer Neg Hx   . Rectal cancer Neg Hx   . Stomach cancer Neg Hx   . Inflammatory bowel disease Neg Hx   . Liver disease Neg Hx   . Pancreatic cancer Neg Hx    I have reviewed his medical, social, and family history in detail and updated the electronic medical record as necessary.    PHYSICAL EXAMINATION  BP 106/70   Pulse 72   Ht 5\' 7"  (1.702 m)   Wt 169 lb (76.7 kg)   BMI 26.47 kg/m  Wt Readings from Last 3 Encounters:  08/10/20 169 lb (76.7 kg)  07/24/20 169 lb 1.6 oz (76.7 kg)  06/13/20 171 lb (77.6 kg)  GEN: NAD, appears stated age, doesn't appear chronically ill PSYCH: Cooperative, without pressured speech EYE: Conjunctivae pink, sclerae  anicteric ENT: MMM CV: Nontachycardic RESP: No audible wheezing GI: NABS, soft, NT/ND, without rebound or guarding, no HSM appreciated  MSK/EXT: No lower extremity edema SKIN: No jaundice NEURO:  Alert & Oriented x 3, no focal deficits   REVIEW OF DATA  I reviewed the following data at the time of this encounter:  GI Procedures and Studies  August 2021 colonoscopy - A large (> 4 cm) polyp was found in the cecum. The polyp was sessile and involving about one-third the circumference of the cecal base. It was adjacent to both the ICV and AO but not entering either. A small area of its surface was ulcerated.  Biopsies were taken with a cold forceps for histology. - One 6 mm polyp in the ascending colon, removed with a cold snare. Resected and retrieved. - The examination was otherwise normal on direct and retroflexion views.  Laboratory Studies  Reviewed those in epic  Imaging Studies  August 2021 CT abdomen pelvis without contrast IMPRESSION: 3.7 cm intraluminal soft tissue mass in the cecum just proximal to the ileocecal valve, highly suspicious for colon carcinoma. Several tiny less than 1 cm right-sided pericolonic lymph nodes, which are nonspecific. No pathologically enlarged lymph nodes identified, however early lymph node metastases cannot be excluded. No evidence of distant metastatic disease.   ASSESSMENT  Mr. Mcclintock is a 49 y.o. male with a pmh significant for allergies, GERD, colon polyps (large cecal TVA with HGD).  The patient is seen today for evaluation and management of:  1. Tubulovillous adenoma of colon   2. Cecal polyp   3. History of colon polyps   4. Family history of colonic polyps   5. Abnormal colonoscopy    Patient is hemodynamically and clinically stable.  Based upon the description and endoscopic pictures I do feel that it is reasonable to pursue an Advanced Polypectomy attempt of the polyp/lesion.  With that being said, the area of ulceration as well  as the CT scan findings are concerning for the potential of a more invasive lesion.  With that being said, biopsies are not consistent at this time with overt adenocarcinoma.  This is the reason, that I think it is reasonable for me to move forward with an attempt at resection.  We discussed some of the techniques of advanced polypectomy which include Endoscopic Mucosal Resection, OVESCO Full-Thickness Resection, Endorotor Morcellation, and Tissue Ablation via Fulguration.  Certainly, if the lesion does not lift, and I will have significant concern about the potential of a more invasive process.  The risks and benefits of endoscopic evaluation were discussed with the patient; these include but are not limited to the risk of perforation, infection, bleeding, missed lesions, lack of diagnosis, severe illness requiring hospitalization, as well as anesthesia and sedation related illnesses.  During attempts at advanced resection, the risks of bleeding and perforation/leak are increased as opposed to diagnostic and screening procedures, and that was discussed with the patient as well.   In addition, I explained that with the possible need for piecemeal resection, subsequent short-interval endoscopic evaluation for follow up and potential retreatment of the lesion/area may be necessary.  I did offer, a referral to surgery in order for patient to have opportunity to discuss surgical management/intervention prior to finalizing decision for attempt at endoscopic removal, however, the patient deferred on this.  If, after attempt at removal of the polyp/lesion, it is found that the patient has a complication or that an invasive lesion or malignant lesion is found, or that the polyp/lesion continues to recur, the patient is aware and understands that surgery may still be indicated/required.  All patient questions were answered, to the best of my ability, and the patient agrees to the aforementioned plan of action with follow-up  as indicated.  We have him scheduled in the next 2 weeks.   PLAN  Preprocedure laboratories as outlined below to be obtained Proceed with our scheduled colonoscopy with EMR attempt If lesion does not lift or is concerning, then endoscopic resection may not be possible and surgical management/evaluation will be required CEA to be obtained to help me understand whether there could be higher risk for invasiveness if this  is already elevated   Orders Placed This Encounter  Procedures  . CBC  . Basic Metabolic Panel (BMET)  . INR/PT  . CEA    New Prescriptions   No medications on file   Modified Medications   No medications on file    Planned Follow Up No follow-ups on file.   Total Time in Face-to-Face and in Coordination of Care for patient including independent/personal interpretation/review of prior testing, medical history, examination, medication adjustment, communicating results with the patient directly, and documentation with the EHR is 30 minutes.   Justice Britain, MD [facility] Advanced Endoscopy Office # [phone]

## 2020-08-10 NOTE — H&P (View-Only) (Signed)
[place] VISIT   Primary Care Provider Brunetta Jeans, PA-C [address] [place] [address] [phone]  Referring Provider Dr. Loletha Carrow  Patient Profile: Sean Hernandez is a 49 y.o. male with a pmh significant for allergies, GERD, colon polyps (large cecal TVA with HGD).  The patient presents to the [facility] for an evaluation and management of problem(s) noted below:  Problem List 1. Tubulovillous adenoma of colon   2. Cecal polyp   3. History of colon polyps   4. Family history of colonic polyps   5. Abnormal colonoscopy     History of Present Illness Please see initial consultation note and progress notes by NP [facility] and Dr. Loletha Carrow for full details of HPI.  Interval History The patient recently underwent a screening colonoscopy (his first).  He was found to have a large cecal polypoid mass.  Biopsies consistent with tubulovillous adenomatous change with high-grade dysplasia.  It is for this reason that the patient was referred for consideration of advanced polyp resection.  Patient underwent a CT scan with results as below.  Biopsies, once again had not shown any evidence of malignancy though deeper invasiveness had not been completely discerned.  Patient describes no particular GI symptoms of melena or hematochezia or significant abdominal pains.  His biggest issues have been GERD related as well as prior pleuritic chest pain.  Patient does not take any significant nonsteroidals or BC/Goody powders.  There is no family history of colon cancer but his mother and father have a history of colon polyps.  GI Review of Systems Positive as above Negative for odynophagia, nausea, vomiting, pain  Review of Systems General: Denies fevers/chills/weight loss unintentionally HEENT: Denies oral lesions Cardiovascular: Denies chest pain Pulmonary: Denies shortness of breath Gastroenterological: See HPI Genitourinary:  Denies darkened urine Hematological: Denies easy bruising/bleeding Dermatological: Denies jaundice Psychological: Mood is stable though anxious to hopefully have this taken care of endoscopically and minimize risk of surgery if possible   Medications Current Outpatient Medications  Medication Sig Dispense Refill  . cetirizine (ZYRTEC) 10 MG tablet Take 10 mg by mouth daily.    . pantoprazole (PROTONIX) 40 MG tablet Take 1 tablet (40 mg total) by mouth daily. 90 tablet 1   No current facility-administered medications for this visit.    Allergies Allergies  Allergen Reactions  . Iodinated Diagnostic Agents Other (See Comments)    Jittery, dizziness, palpitations    Histories Past Medical History:  Diagnosis Date  . Allergy   . GERD (gastroesophageal reflux disease)   . Osteoid osteoma    Past Surgical History:  Procedure Laterality Date  . DENTAL SURGERY    . NASAL RECONSTRUCTION    . OSTEOTOMY FIBULA     osteoasteoma   Social History   Socioeconomic History  . Marital status: Married    Spouse name: Not on file  . Number of children: Not on file  . Years of education: Not on file  . Highest education level: Not on file  Occupational History  . Not on file  Tobacco Use  . Smoking status: Never Smoker  . Smokeless tobacco: Never Used  Vaping Use  . Vaping Use: Never used  Substance and Sexual Activity  . Alcohol use: Yes    Alcohol/week: 3.0 standard drinks    Types: 1 Glasses of wine, 1 Cans of beer, 1 Shots of liquor per week    Comment: ocassionally  . Drug use: Never  . Sexual  activity: Yes  Other Topics Concern  . Not on file  Social History Narrative  . Not on file   Social Determinants of Health   Financial Resource Strain:   . Difficulty of Paying Living Expenses: Not on file  Food Insecurity:   . Worried About Charity fundraiser in the Last Year: Not on file  . Ran Out of Food in the Last Year: Not on file  Transportation Needs:   . Lack  of Transportation (Medical): Not on file  . Lack of Transportation (Non-Medical): Not on file  Physical Activity:   . Days of Exercise per Week: Not on file  . Minutes of Exercise per Session: Not on file  Stress:   . Feeling of Stress : Not on file  Social Connections:   . Frequency of Communication with Friends and Family: Not on file  . Frequency of Social Gatherings with Friends and Family: Not on file  . Attends Religious Services: Not on file  . Active Member of Clubs or Organizations: Not on file  . Attends Archivist Meetings: Not on file  . Marital Status: Not on file  Intimate Partner Violence:   . Fear of Current or Ex-Partner: Not on file  . Emotionally Abused: Not on file  . Physically Abused: Not on file  . Sexually Abused: Not on file   Family History  Problem Relation Age of Onset  . Dementia Mother 82       Alzheimer's  . Colon polyps Mother   . Hypertension Mother   . Hypertension Father   . COPD Father   . Colon polyps Father   . Autism Daughter   . Depression Daughter   . Lung cancer Paternal Grandfather        Smoker  . Dementia Maternal Uncle        Alzheimer's  . Lung cancer Paternal Uncle        w/ metastatsis; Unsure of smoking status  . Colon cancer Neg Hx   . Esophageal cancer Neg Hx   . Rectal cancer Neg Hx   . Stomach cancer Neg Hx   . Inflammatory bowel disease Neg Hx   . Liver disease Neg Hx   . Pancreatic cancer Neg Hx    I have reviewed his medical, social, and family history in detail and updated the electronic medical record as necessary.    PHYSICAL EXAMINATION  BP 106/70   Pulse 72   Ht 5\' 7"  (1.702 m)   Wt 169 lb (76.7 kg)   BMI 26.47 kg/m  Wt Readings from Last 3 Encounters:  08/10/20 169 lb (76.7 kg)  07/24/20 169 lb 1.6 oz (76.7 kg)  06/13/20 171 lb (77.6 kg)  GEN: NAD, appears stated age, doesn't appear chronically ill PSYCH: Cooperative, without pressured speech EYE: Conjunctivae pink, sclerae  anicteric ENT: MMM CV: Nontachycardic RESP: No audible wheezing GI: NABS, soft, NT/ND, without rebound or guarding, no HSM appreciated  MSK/EXT: No lower extremity edema SKIN: No jaundice NEURO:  Alert & Oriented x 3, no focal deficits   REVIEW OF DATA  I reviewed the following data at the time of this encounter:  GI Procedures and Studies  August 2021 colonoscopy - A large (> 4 cm) polyp was found in the cecum. The polyp was sessile and involving about one-third the circumference of the cecal base. It was adjacent to both the ICV and AO but not entering either. A small area of its surface was ulcerated.  Biopsies were taken with a cold forceps for histology. - One 6 mm polyp in the ascending colon, removed with a cold snare. Resected and retrieved. - The examination was otherwise normal on direct and retroflexion views.  Laboratory Studies  Reviewed those in epic  Imaging Studies  August 2021 CT abdomen pelvis without contrast IMPRESSION: 3.7 cm intraluminal soft tissue mass in the cecum just proximal to the ileocecal valve, highly suspicious for colon carcinoma. Several tiny less than 1 cm right-sided pericolonic lymph nodes, which are nonspecific. No pathologically enlarged lymph nodes identified, however early lymph node metastases cannot be excluded. No evidence of distant metastatic disease.   ASSESSMENT  Mr. Mellinger is a 49 y.o. male with a pmh significant for allergies, GERD, colon polyps (large cecal TVA with HGD).  The patient is seen today for evaluation and management of:  1. Tubulovillous adenoma of colon   2. Cecal polyp   3. History of colon polyps   4. Family history of colonic polyps   5. Abnormal colonoscopy    Patient is hemodynamically and clinically stable.  Based upon the description and endoscopic pictures I do feel that it is reasonable to pursue an Advanced Polypectomy attempt of the polyp/lesion.  With that being said, the area of ulceration as well  as the CT scan findings are concerning for the potential of a more invasive lesion.  With that being said, biopsies are not consistent at this time with overt adenocarcinoma.  This is the reason, that I think it is reasonable for me to move forward with an attempt at resection.  We discussed some of the techniques of advanced polypectomy which include Endoscopic Mucosal Resection, OVESCO Full-Thickness Resection, Endorotor Morcellation, and Tissue Ablation via Fulguration.  Certainly, if the lesion does not lift, and I will have significant concern about the potential of a more invasive process.  The risks and benefits of endoscopic evaluation were discussed with the patient; these include but are not limited to the risk of perforation, infection, bleeding, missed lesions, lack of diagnosis, severe illness requiring hospitalization, as well as anesthesia and sedation related illnesses.  During attempts at advanced resection, the risks of bleeding and perforation/leak are increased as opposed to diagnostic and screening procedures, and that was discussed with the patient as well.   In addition, I explained that with the possible need for piecemeal resection, subsequent short-interval endoscopic evaluation for follow up and potential retreatment of the lesion/area may be necessary.  I did offer, a referral to surgery in order for patient to have opportunity to discuss surgical management/intervention prior to finalizing decision for attempt at endoscopic removal, however, the patient deferred on this.  If, after attempt at removal of the polyp/lesion, it is found that the patient has a complication or that an invasive lesion or malignant lesion is found, or that the polyp/lesion continues to recur, the patient is aware and understands that surgery may still be indicated/required.  All patient questions were answered, to the best of my ability, and the patient agrees to the aforementioned plan of action with follow-up  as indicated.  We have him scheduled in the next 2 weeks.   PLAN  Preprocedure laboratories as outlined below to be obtained Proceed with our scheduled colonoscopy with EMR attempt If lesion does not lift or is concerning, then endoscopic resection may not be possible and surgical management/evaluation will be required CEA to be obtained to help me understand whether there could be higher risk for invasiveness if this  is already elevated   Orders Placed This Encounter  Procedures  . CBC  . Basic Metabolic Panel (BMET)  . INR/PT  . CEA    New Prescriptions   No medications on file   Modified Medications   No medications on file    Planned Follow Up No follow-ups on file.   Total Time in Face-to-Face and in Coordination of Care for patient including independent/personal interpretation/review of prior testing, medical history, examination, medication adjustment, communicating results with the patient directly, and documentation with the EHR is 30 minutes.   Justice Britain, MD [facility] Advanced Endoscopy Office # [phone]

## 2020-08-10 NOTE — Patient Instructions (Addendum)
If you are age 51 or older, your body mass index should be between 23-30. Your Body mass index is 26.47 kg/m. If this is out of the aforementioned range listed, please consider follow up with your Primary Care Provider.  If you are age 26 or younger, your body mass index should be between 19-25. Your Body mass index is 26.47 kg/m. If this is out of the aformentioned range listed, please consider follow up with your Primary Care Provider.    Instructions for your colonoscopy prep were sent via mychart. We printed copy of instructions and gave to you today. Please follow written instructions.   In addition to your split dose prep of Miralax- Please start 1 capful daily for 3 days prior to your procedure.   Your provider has requested that you go to the basement level for lab work before leaving today. Press "B" on the elevator. The lab is located at the first door on the left as you exit the elevator.  Thank you for choosing me and [facility].  Dr. Rush Landmark

## 2020-08-13 DIAGNOSIS — Z8371 Family history of colonic polyps: Secondary | ICD-10-CM | POA: Insufficient documentation

## 2020-08-13 DIAGNOSIS — R933 Abnormal findings on diagnostic imaging of other parts of digestive tract: Secondary | ICD-10-CM | POA: Insufficient documentation

## 2020-08-13 DIAGNOSIS — K635 Polyp of colon: Secondary | ICD-10-CM | POA: Insufficient documentation

## 2020-08-13 DIAGNOSIS — Z86010 Personal history of colonic polyps: Secondary | ICD-10-CM | POA: Insufficient documentation

## 2020-08-13 DIAGNOSIS — D126 Benign neoplasm of colon, unspecified: Secondary | ICD-10-CM | POA: Insufficient documentation

## 2020-08-24 ENCOUNTER — Encounter (HOSPITAL_COMMUNITY): Payer: Self-pay | Admitting: Gastroenterology

## 2020-08-24 NOTE — Progress Notes (Signed)
Patient denies shortness of breath, fever, cough or chest pain.  PCP - Raiford Noble PA-C  Cardiologist - n/a  Chest x-ray - 04/06/20 EKG - 03/23/20 Stress Test - n/a ECHO - n/a Cardiac Cath - n/a  STOP now taking any Aspirin (unless otherwise instructed by your surgeon), Aleve, Naproxen, Ibuprofen, Motrin, Advil, Goody's, BC's, all herbal medications, fish oil, and all vitamins.   Coronavirus Screening Covid test scheduled on 08/25/20 Do you have any of the following symptoms:  Cough yes/no: No Fever (>100.12F)  yes/no: No Runny nose yes/no: No Sore throat yes/no: No Difficulty breathing/shortness of breath  yes/no: No  Have you traveled in the last 14 days and where? yes/no: No  Patient verbalized understanding of instructions that were given via phone.

## 2020-08-25 ENCOUNTER — Other Ambulatory Visit (HOSPITAL_COMMUNITY)
Admission: RE | Admit: 2020-08-25 | Discharge: 2020-08-25 | Disposition: A | Discharge status: Home / Self Care | Payer: BC Managed Care – PPO | Source: Ambulatory Visit | Attending: Gastroenterology | Admitting: Gastroenterology

## 2020-08-25 DIAGNOSIS — Z01812 Encounter for preprocedural laboratory examination: Secondary | ICD-10-CM | POA: Diagnosis not present

## 2020-08-25 DIAGNOSIS — Z20822 Contact with and (suspected) exposure to covid-19: Secondary | ICD-10-CM | POA: Insufficient documentation

## 2020-08-25 LAB — SARS CORONAVIRUS 2 (TAT 6-24 HRS): SARS Coronavirus 2: NEGATIVE

## 2020-08-27 ENCOUNTER — Encounter (HOSPITAL_COMMUNITY)
Admission: RE | Disposition: A | Discharge status: Home / Self Care | Payer: Self-pay | Source: Home / Self Care | Attending: Gastroenterology

## 2020-08-27 ENCOUNTER — Other Ambulatory Visit: Payer: Self-pay

## 2020-08-27 ENCOUNTER — Ambulatory Visit (HOSPITAL_COMMUNITY): Payer: BC Managed Care – PPO | Admitting: Anesthesiology

## 2020-08-27 ENCOUNTER — Encounter (HOSPITAL_COMMUNITY): Payer: Self-pay | Admitting: Gastroenterology

## 2020-08-27 ENCOUNTER — Ambulatory Visit (HOSPITAL_COMMUNITY)
Admission: RE | Admit: 2020-08-27 | Discharge: 2020-08-27 | Disposition: A | Discharge status: Home / Self Care | Payer: BC Managed Care – PPO | Attending: Gastroenterology | Admitting: Gastroenterology

## 2020-08-27 DIAGNOSIS — K219 Gastro-esophageal reflux disease without esophagitis: Secondary | ICD-10-CM | POA: Diagnosis not present

## 2020-08-27 DIAGNOSIS — Z8371 Family history of colonic polyps: Secondary | ICD-10-CM | POA: Insufficient documentation

## 2020-08-27 DIAGNOSIS — Z810 Family history of intellectual disabilities: Secondary | ICD-10-CM | POA: Diagnosis not present

## 2020-08-27 DIAGNOSIS — K641 Second degree hemorrhoids: Secondary | ICD-10-CM | POA: Diagnosis not present

## 2020-08-27 DIAGNOSIS — Z825 Family history of asthma and other chronic lower respiratory diseases: Secondary | ICD-10-CM | POA: Insufficient documentation

## 2020-08-27 DIAGNOSIS — Z91041 Radiographic dye allergy status: Secondary | ICD-10-CM | POA: Diagnosis not present

## 2020-08-27 DIAGNOSIS — Z86010 Personal history of colonic polyps: Secondary | ICD-10-CM | POA: Insufficient documentation

## 2020-08-27 DIAGNOSIS — Z801 Family history of malignant neoplasm of trachea, bronchus and lung: Secondary | ICD-10-CM | POA: Insufficient documentation

## 2020-08-27 DIAGNOSIS — M4690 Unspecified inflammatory spondylopathy, site unspecified: Secondary | ICD-10-CM | POA: Diagnosis not present

## 2020-08-27 DIAGNOSIS — K635 Polyp of colon: Secondary | ICD-10-CM

## 2020-08-27 DIAGNOSIS — Z818 Family history of other mental and behavioral disorders: Secondary | ICD-10-CM | POA: Insufficient documentation

## 2020-08-27 DIAGNOSIS — Z8249 Family history of ischemic heart disease and other diseases of the circulatory system: Secondary | ICD-10-CM | POA: Insufficient documentation

## 2020-08-27 DIAGNOSIS — C180 Malignant neoplasm of cecum: Secondary | ICD-10-CM | POA: Insufficient documentation

## 2020-08-27 DIAGNOSIS — D120 Benign neoplasm of cecum: Secondary | ICD-10-CM | POA: Diagnosis not present

## 2020-08-27 DIAGNOSIS — Z820 Family history of epilepsy and other diseases of the nervous system: Secondary | ICD-10-CM | POA: Diagnosis not present

## 2020-08-27 HISTORY — DX: Unspecified osteoarthritis, unspecified site: M19.90

## 2020-08-27 HISTORY — PX: SUBMUCOSAL LIFTING INJECTION: SHX6855

## 2020-08-27 HISTORY — PX: BIOPSY: SHX5522

## 2020-08-27 HISTORY — PX: COLONOSCOPY WITH PROPOFOL: SHX5780

## 2020-08-27 HISTORY — PX: SUBMUCOSAL TATTOO INJECTION: SHX6856

## 2020-08-27 SURGERY — COLONOSCOPY WITH PROPOFOL
Anesthesia: Monitor Anesthesia Care

## 2020-08-27 MED ORDER — ONDANSETRON HCL 4 MG/2ML IJ SOLN
INTRAMUSCULAR | Status: DC | PRN
  Administered 2020-08-27: 4 mg via INTRAVENOUS

## 2020-08-27 MED ORDER — SODIUM CHLORIDE 0.9 % IV SOLN: INTRAVENOUS | Status: DC

## 2020-08-27 MED ORDER — SPOT INK MARKER SYRINGE KIT
PACK | SUBMUCOSAL | Status: DC | PRN
  Administered 2020-08-27: 2 mL via SUBMUCOSAL

## 2020-08-27 MED ORDER — LACTATED RINGERS IV SOLN: INTRAVENOUS | Status: DC | PRN

## 2020-08-27 MED ORDER — PROPOFOL 500 MG/50ML IV EMUL
INTRAVENOUS | Status: DC | PRN
  Administered 2020-08-27: 100 ug/kg/min via INTRAVENOUS

## 2020-08-27 MED ORDER — PROPOFOL 10 MG/ML IV BOLUS
INTRAVENOUS | Status: DC | PRN
  Administered 2020-08-27: 30 mg via INTRAVENOUS
  Administered 2020-08-27: 20 mg via INTRAVENOUS

## 2020-08-27 MED ORDER — LIDOCAINE 2% (20 MG/ML) 5 ML SYRINGE
INTRAMUSCULAR | Status: DC | PRN
  Administered 2020-08-27: 80 mg via INTRAVENOUS

## 2020-08-27 SURGICAL SUPPLY — 21 items

## 2020-08-27 NOTE — Transfer of Care (Signed)
Immediate Anesthesia Transfer of Care Note  Patient: Sean Hernandez  Procedure(s) Performed: COLONOSCOPY WITH PROPOFOL (N/A ) SUBMUCOSAL LIFTING INJECTION BIOPSY SUBMUCOSAL TATTOO INJECTION  Patient Location: PACU  Anesthesia Type:MAC  Level of Consciousness: awake, alert  and oriented  Airway & Oxygen Therapy: Patient Spontanous Breathing  Post-op Assessment: Report given to RN and Post -op Vital signs reviewed and stable  Post vital signs: Reviewed and stable  Last Vitals:  Vitals Value Taken Time  BP 125/51 08/27/20 1320  Temp    Pulse 49 08/27/20 1321  Resp 11 08/27/20 1321  SpO2 98 % 08/27/20 1321  Vitals shown include unvalidated device data.  Last Pain:  Vitals:   08/27/20 1020  TempSrc: Oral  PainSc: 0-No pain         Complications: No complications documented.

## 2020-08-27 NOTE — Discharge Instructions (Signed)

## 2020-08-27 NOTE — Interval H&P Note (Signed)
History and Physical Interval Note:  08/27/2020 10:26 AM  Sean Hernandez  has presented today for surgery, with the diagnosis of cecal polyp.  The various methods of treatment have been discussed with the patient and family. After consideration of risks, benefits and other options for treatment, the patient has consented to  Procedure(s): COLONOSCOPY WITH PROPOFOL (N/A) ENDOSCOPIC MUCOSAL RESECTION (N/A) as a surgical intervention.  The patient's history has been reviewed, patient examined, no change in status, stable for surgery.  I have reviewed the patient's chart and labs.  Questions were answered to the patient's satisfaction.     [employer]

## 2020-08-27 NOTE — Anesthesia Preprocedure Evaluation (Signed)
Anesthesia Evaluation  Patient identified by MRN, date of birth, ID band Patient awake    Reviewed: Allergy & Precautions, H&P , NPO status , Patient's Chart, lab work & pertinent test results  Airway Mallampati: II   Neck ROM: full    Dental   Pulmonary neg pulmonary ROS,    breath sounds clear to auscultation       Cardiovascular negative cardio ROS   Rhythm:regular Rate:Normal     Neuro/Psych    GI/Hepatic GERD  ,  Endo/Other    Renal/GU      Musculoskeletal  (+) Arthritis ,   Abdominal   Peds  Hematology   Anesthesia Other Findings   Reproductive/Obstetrics                             Anesthesia Physical Anesthesia Plan  ASA: II  Anesthesia Plan: MAC   Post-op Pain Management:    Induction: Intravenous  PONV Risk Score and Plan: 1 and Propofol infusion, Ondansetron and Treatment may vary due to age or medical condition  Airway Management Planned: Simple Face Mask  Additional Equipment:   Intra-op Plan:   Post-operative Plan:   Informed Consent: I have reviewed the patients History and Physical, chart, labs and discussed the procedure including the risks, benefits and alternatives for the proposed anesthesia with the patient or authorized representative who has indicated his/her understanding and acceptance.       Plan Discussed with: CRNA, Anesthesiologist and Surgeon  Anesthesia Plan Comments:         Anesthesia Quick Evaluation

## 2020-08-27 NOTE — Op Note (Signed)
[facility] Patient Name: Sean Hernandez Procedure Date : 08/27/2020 MRN: [id number] Attending MD: Justice Britain , MD Date of Birth: 02/08/1971 CSN: [id number] Age: 49 Admit Type: Inpatient Procedure:                Colonoscopy Indications:              Excision of colonic polyp Providers:                Justice Britain, MD, Jeanella Cara, RN,                            Ladona Ridgel, Technician Referring MD:             Estill Cotta. Loletha Carrow, MD, Brunetta Jeans PA-C, PA Medicines:                Monitored Anesthesia Care Complications:            No immediate complications. Estimated Blood Loss:     Estimated blood loss was minimal. Procedure:                Pre-Anesthesia Assessment:                           - Prior to the procedure, a History and Physical                            was performed, and patient medications and                            allergies were reviewed. The patient's tolerance of                            previous anesthesia was also reviewed. The risks                            and benefits of the procedure and the sedation                            options and risks were discussed with the patient.                            All questions were answered, and informed consent                            was obtained. Prior Anticoagulants: The patient has                            taken no previous anticoagulant or antiplatelet                            agents. ASA Grade Assessment: II - A patient with                            mild systemic disease. After reviewing the risks  and benefits, the patient was deemed in                            satisfactory condition to undergo the procedure.                           After obtaining informed consent, the colonoscope                            was passed under direct vision. Throughout the                            procedure, the patient's blood  pressure, pulse, and                            oxygen saturations were monitored continuously. The                            CF-HQ190L ([id number] Olympus colonoscope was                            introduced through the anus and advanced to the 5                            cm into the ileum. The colonoscopy was performed                            without difficulty. The patient tolerated the                            procedure. The quality of the bowel preparation was                            adequate. The terminal ileum, ileocecal valve,                            appendiceal orifice, and rectum were photographed. Scope In: 12:23:26 PM Scope Out: 1:07:44 PM Scope Withdrawal Time: 0 hours 40 minutes 27 seconds  Total Procedure Duration: 0 hours 44 minutes 18 seconds  Findings:      The digital rectal exam findings include hemorrhoids. Pertinent       negatives include no palpable rectal lesions.      The terminal ileum and ileocecal valve appeared normal.      A greater than 50 mm ulcerated polyp was found in the cecum - it did       encroach but did not go into the IC Valve and did not involve the       appendiceal orifice. The polyp was mixed - granular/non-granular and       lateral spreading (majority of polyp was JNET Type 2a pit pattern,       however, the central portion of the lesion was noted to have loss of       pit-pattern was harder in touch/manipulation and closer to JNET Type 3       classification. I attempted lift to evaluate for potential  resection of       the lesion, and only approximately 50% of the polyp lifted. This       significant portion that did not lift made me concerned about the       potential for resection as did the overall pit-pattern and ulceration.       Biopsies were taken with a cold forceps for histology to delineate       potential malignancy. Tattoo placed 6 cm from the cecum in the ascending       colon.      Normal mucosa was found in the  entire colon otherwise.      Non-bleeding non-thrombosed internal hemorrhoids were found during       retroflexion, during perianal exam and during digital exam. The       hemorrhoids were Grade II (internal hemorrhoids that prolapse but reduce       spontaneously). Impression:               - Hemorrhoids found on digital rectal exam.                           - The examined portion of the ileum was normal.                           - One greater than 50 mm ulcerated JNET 2a/3                            non-completely lifting polyp was found in the                            cecum. As noted above, decision made to not pursue                            attempt at endoscopic resection due to concern for                            at least submucosal invasion if not further                            malignancy. Biopsied. Tattooed 6 cm from the lesion                            in the ascending colon.                           - Normal mucosa in the entire examined colon                            otherwise.                           - Non-bleeding non-thrombosed internal hemorrhoids. Recommendation:           - The patient will be observed post-procedure,                            until all discharge criteria are met.                           -  Discharge patient to home.                           - Patient has a contact number available for                            emergencies. The signs and symptoms of potential                            delayed complications were discussed with the                            patient. Return to normal activities tomorrow.                            Written discharge instructions were provided to the                            patient.                           - Resume previous diet.                           - Continue present medications.                           - Await pathology results - rushed today.                           - Urgent referral  to a colo-rectal surgeon at                            appointment to be scheduled for definitive                            management of the polypoid lesion - concerning for                            malignancy.                           - Repeat Colonoscopy in 1-year after completion of                            intervention for the colon polyp (ie surgery).                           - The findings and recommendations were discussed                            with the patient.                           - The findings and recommendations were discussed  with the patient's family.                           - The findings and recommendations were discussed                            with the referring physician. Procedure Code(s):        --- Professional ---                           [phone], Colonoscopy, flexible; with biopsy, single                            or multiple Diagnosis Code(s):        --- Professional ---                           K64.1, Second degree hemorrhoids                           K63.5, Polyp of colon CPT copyright 2019 [employer]. All rights reserved. The codes documented in this report are preliminary and upon coder review may  be revised to meet current compliance requirements. Justice Britain, MD 08/27/2020 1:29:41 PM Number of Addenda: 0

## 2020-08-28 ENCOUNTER — Encounter: Payer: Self-pay | Admitting: Gastroenterology

## 2020-08-28 ENCOUNTER — Other Ambulatory Visit (HOSPITAL_COMMUNITY)
Admission: RE | Admit: 2020-08-28 | Discharge: 2020-08-28 | Disposition: A | Discharge status: Home / Self Care | Payer: BC Managed Care – PPO | Source: Ambulatory Visit | Attending: Gastroenterology | Admitting: Gastroenterology

## 2020-08-28 ENCOUNTER — Other Ambulatory Visit: Payer: Self-pay

## 2020-08-28 DIAGNOSIS — C189 Malignant neoplasm of colon, unspecified: Secondary | ICD-10-CM

## 2020-08-28 DIAGNOSIS — Z20822 Contact with and (suspected) exposure to covid-19: Secondary | ICD-10-CM | POA: Insufficient documentation

## 2020-08-28 DIAGNOSIS — Z01812 Encounter for preprocedural laboratory examination: Secondary | ICD-10-CM | POA: Insufficient documentation

## 2020-08-28 LAB — SARS CORONAVIRUS 2 (TAT 6-24 HRS): SARS Coronavirus 2: NEGATIVE

## 2020-08-28 NOTE — Anesthesia Postprocedure Evaluation (Signed)
Anesthesia Post Note  Patient: Sean Hernandez  Procedure(s) Performed: COLONOSCOPY WITH PROPOFOL (N/A ) SUBMUCOSAL LIFTING INJECTION BIOPSY SUBMUCOSAL TATTOO INJECTION     Patient location during evaluation: Endoscopy Anesthesia Type: MAC Level of consciousness: awake and alert Pain management: pain level controlled Vital Signs Assessment: post-procedure vital signs reviewed and stable Respiratory status: spontaneous breathing, nonlabored ventilation, respiratory function stable and patient connected to nasal cannula oxygen Cardiovascular status: blood pressure returned to baseline and stable Postop Assessment: no apparent nausea or vomiting Anesthetic complications: no   No complications documented.  Last Vitals:  Vitals:   08/27/20 1340 08/27/20 1350  BP: 132/65 (!) 135/58  Pulse: (!) 50 (!) 50  Resp: 16 13  Temp:    SpO2: 100% 100%    Last Pain:  Vitals:   08/27/20 1350  TempSrc:   PainSc: 0-No pain                 Malvina Schadler S

## 2020-08-29 ENCOUNTER — Encounter (HOSPITAL_COMMUNITY): Payer: Self-pay | Admitting: Gastroenterology

## 2020-08-30 LAB — SURGICAL PATHOLOGY

## 2020-08-31 ENCOUNTER — Encounter (HOSPITAL_COMMUNITY)
Admission: RE | Disposition: A | Discharge status: Home / Self Care | Payer: Self-pay | Source: Home / Self Care | Attending: Gastroenterology

## 2020-08-31 ENCOUNTER — Encounter (HOSPITAL_COMMUNITY): Payer: Self-pay | Admitting: Gastroenterology

## 2020-08-31 ENCOUNTER — Ambulatory Visit (HOSPITAL_COMMUNITY)
Admission: RE | Admit: 2020-08-31 | Discharge: 2020-08-31 | Disposition: A | Discharge status: Home / Self Care | Payer: BC Managed Care – PPO | Attending: Gastroenterology | Admitting: Gastroenterology

## 2020-08-31 DIAGNOSIS — R1310 Dysphagia, unspecified: Secondary | ICD-10-CM | POA: Diagnosis not present

## 2020-08-31 HISTORY — PX: ESOPHAGEAL MANOMETRY: SHX5429

## 2020-08-31 SURGERY — MANOMETRY, ESOPHAGUS

## 2020-08-31 MED ORDER — LIDOCAINE VISCOUS HCL 2 % MT SOLN
OROMUCOSAL | Status: AC
  Filled 2020-08-31: qty 15

## 2020-08-31 SURGICAL SUPPLY — 2 items
FACESHIELD LNG OPTICON STERILE (SAFETY) IMPLANT
GLOVE BIO SURGEON STRL SZ8 (GLOVE) ×6 IMPLANT

## 2020-08-31 NOTE — Progress Notes (Signed)
Esophageal manometry performed per protocol.  Patient tolerated well.  Report to be sent to Dr. Kavitha Nandigam 

## 2020-09-03 ENCOUNTER — Encounter (HOSPITAL_COMMUNITY): Payer: Self-pay | Admitting: Gastroenterology

## 2020-09-03 ENCOUNTER — Other Ambulatory Visit: Payer: Self-pay

## 2020-09-03 ENCOUNTER — Ambulatory Visit (HOSPITAL_COMMUNITY)
Admission: RE | Admit: 2020-09-03 | Discharge: 2020-09-03 | Disposition: A | Discharge status: Home / Self Care | Payer: BC Managed Care – PPO | Source: Ambulatory Visit | Attending: Gastroenterology | Admitting: Gastroenterology

## 2020-09-03 DIAGNOSIS — J984 Other disorders of lung: Secondary | ICD-10-CM | POA: Diagnosis not present

## 2020-09-03 DIAGNOSIS — C189 Malignant neoplasm of colon, unspecified: Secondary | ICD-10-CM | POA: Insufficient documentation

## 2020-09-03 DIAGNOSIS — K769 Liver disease, unspecified: Secondary | ICD-10-CM

## 2020-09-03 IMAGING — CT CT CHEST W/O CM
2 of 4 series · 15 of 36 positions shown, 18 images · non-contrast
Comparison: CT abdomen and pelvis [DATE]

CLINICAL DATA: Gastrointestinal neoplasm, history of colon cancer
staging evaluation in this 48-year-old male

EXAM:
CT CHEST WITHOUT CONTRAST
TECHNIQUE: Multidetector CT imaging of the chest was performed following the
standard protocol without IV contrast.

[Series 2: thorax · axial · 0.82mm/px · z∈[+1318,+1600]mm · 12 of 167 slices shown, 15 images]
[im 13/167  mediastinal]
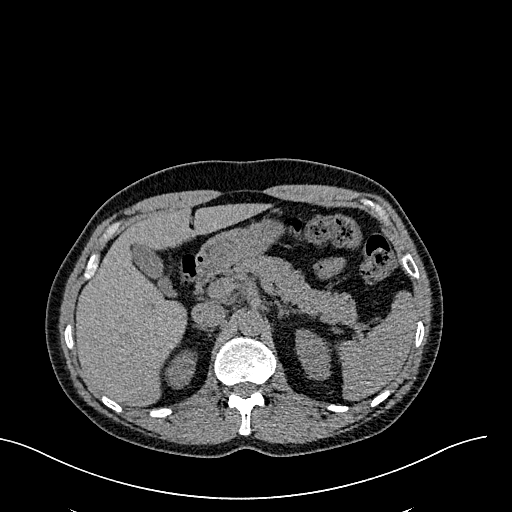
[im 13/167  lung]
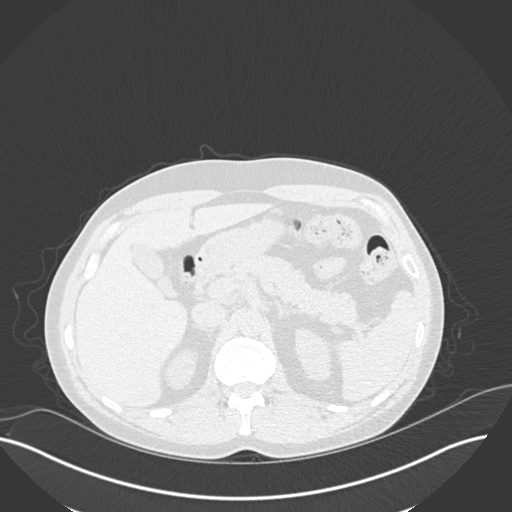
[im 26/167  lung]
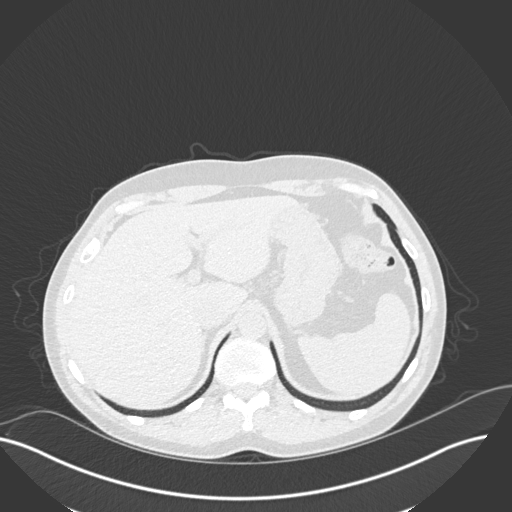
[im 39/167  lung]
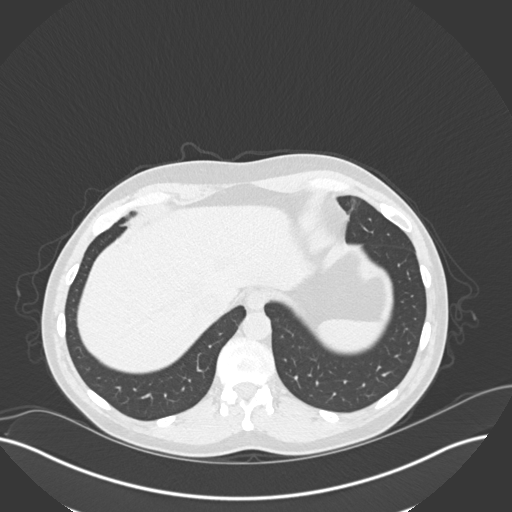
[im 52/167  lung]
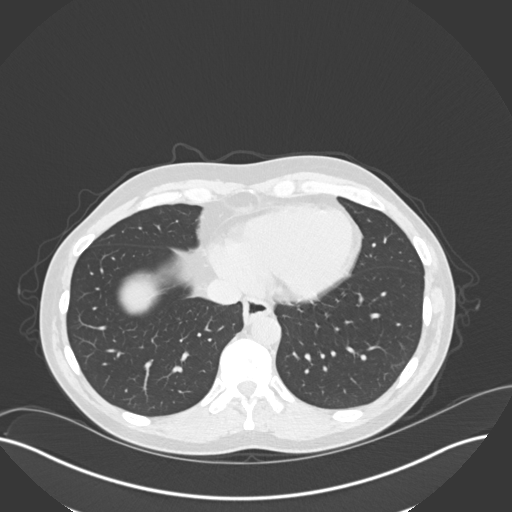
[im 64/167  mediastinal]
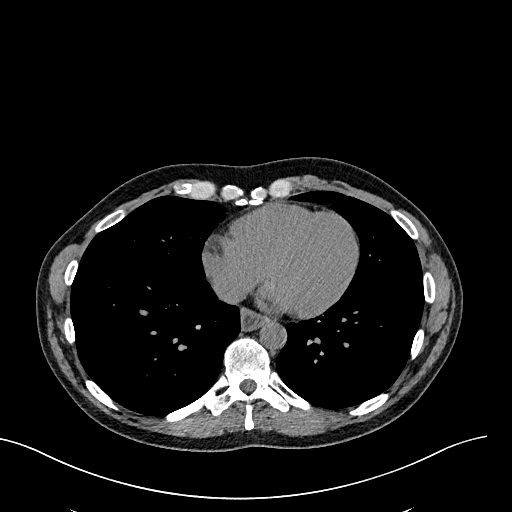
[im 64/167  lung]
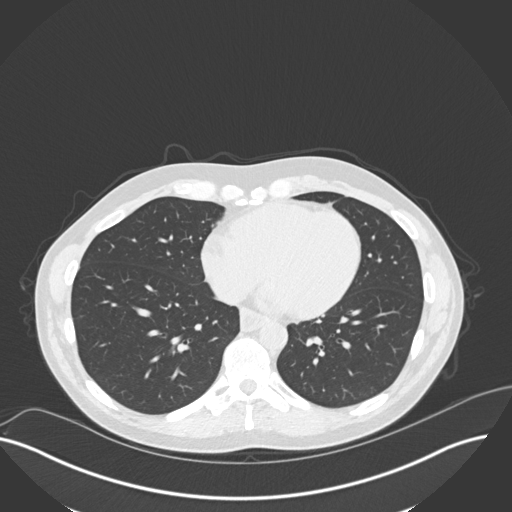
[im 77/167  lung]
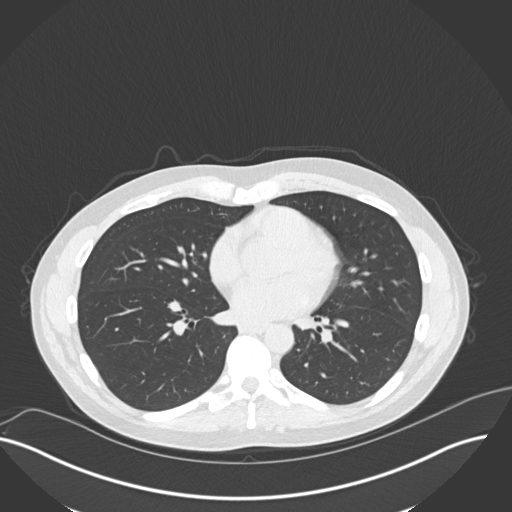
[im 90/167  lung]
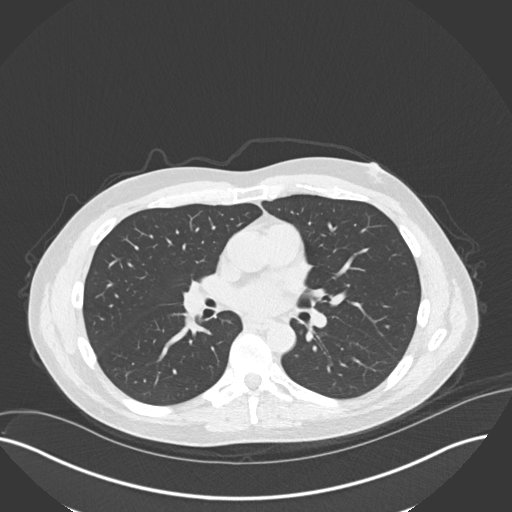
[im 103/167  lung]
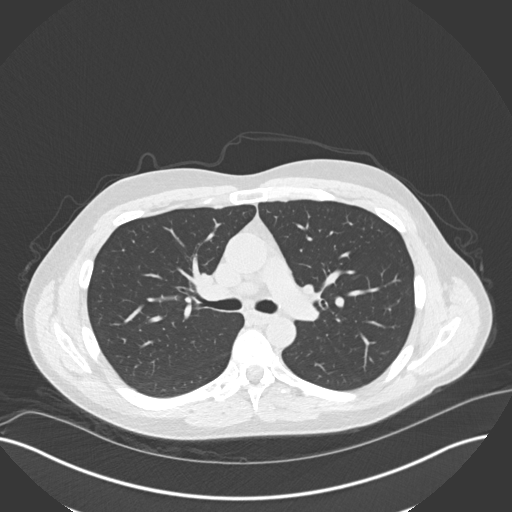
[im 115/167  mediastinal]
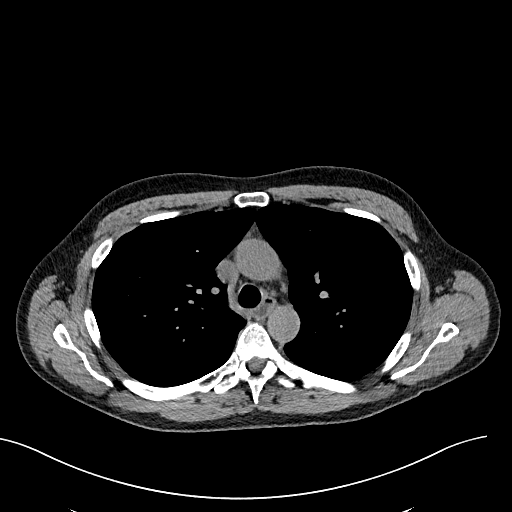
[im 115/167  lung]
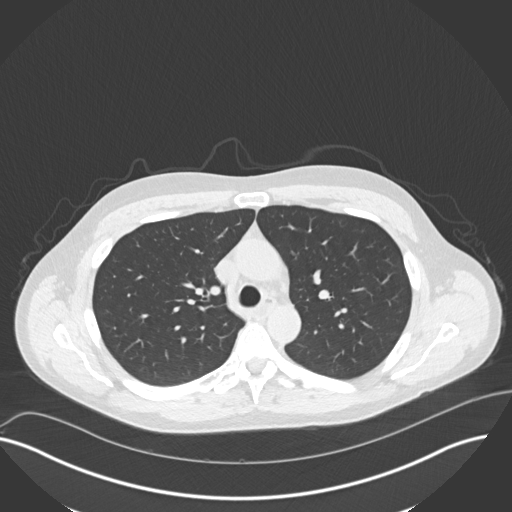
[im 128/167  lung]
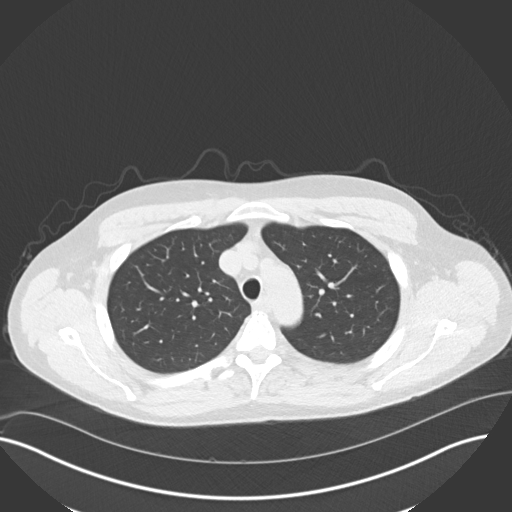
[im 141/167  lung]
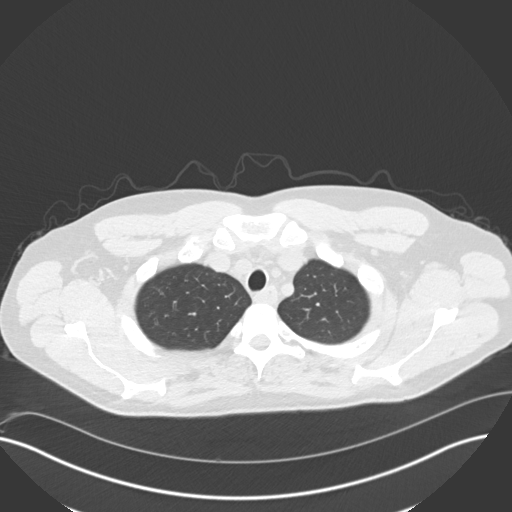
[im 154/167  lung]
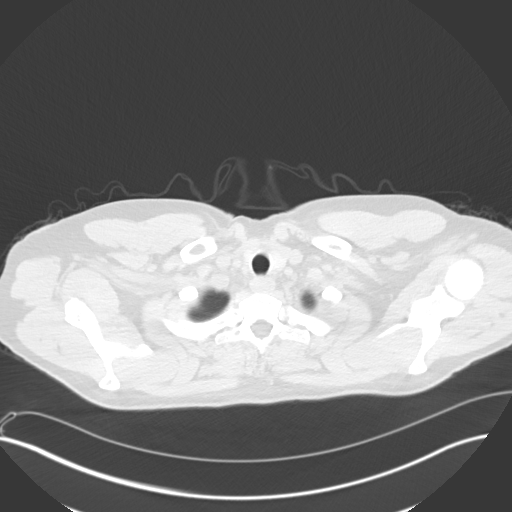

[Series 6: coronal · coronal · 0.65mm/px · 3 of 124 slices shown]
[im 25/124  lung]
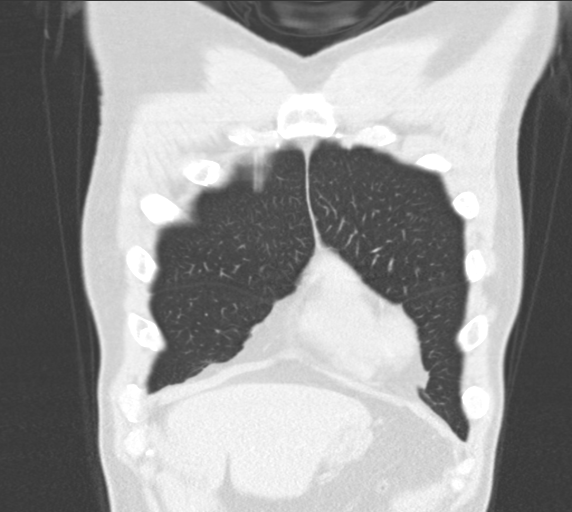
[im 50/124  lung]
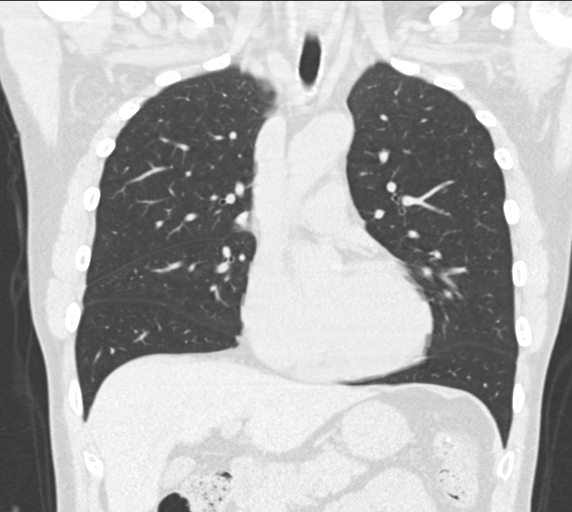
[im 74/124  lung]
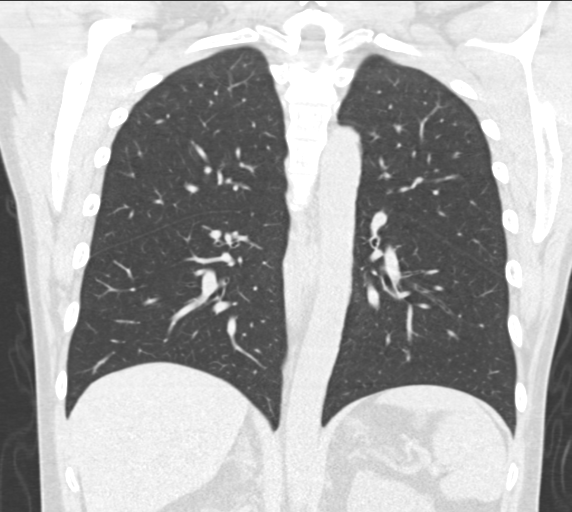

[15 of 36 positions shown; findings below may reference images not displayed]

FINDINGS: Cardiovascular: Noncontrast appearance of the heart and great
vessels is normal. Limited assessment.

Mediastinum/Nodes: Thoracic inlet structures are normal.

No axillary lymphadenopathy.

No mediastinal lymphadenopathy. Esophagus mildly patulous. No gross
hilar adenopathy.

Lungs/Pleura: No consolidation. No pleural effusion. Airways are
patent. Minimal scarring along the RIGHT middle lobe medially. No
pulmonary mass or nodule.

Upper Abdomen: Incidental imaging of upper abdominal contents
without acute process. Subtle area of low attenuation along the
margin of the superior aspect of the LEFT hepatic lobe (image 125 of
series 2) between 10 and 11 mm size.

Musculoskeletal: No acute bone finding. No destructive bone process.
IMPRESSION: 1. No findings of metastatic disease to the chest.
2. Low-attenuation focus along the liver margin, not definitely
present on previous imaging. While this likely represents a small
cyst it cannot be definitively characterized, and given the
patient's history of colonic neoplasm MRI is suggested with and
without contrast for further evaluation.

These results will be called to the ordering clinician or
representative by the Radiologist Assistant, and communication
documented in the PACS or [REDACTED].

## 2020-09-04 ENCOUNTER — Ambulatory Visit: Payer: Self-pay | Admitting: Surgery

## 2020-09-04 DIAGNOSIS — R160 Hepatomegaly, not elsewhere classified: Secondary | ICD-10-CM | POA: Diagnosis not present

## 2020-09-04 DIAGNOSIS — C180 Malignant neoplasm of cecum: Secondary | ICD-10-CM | POA: Diagnosis not present

## 2020-09-04 NOTE — H&P (Signed)
Sean Hernandez Appointment: 09/04/2020 12:00 PM Location: [facility] Patient #: [id number] DOB: January 07, 1971 Married / Language: English / Race: White Male  History of Present Illness Sean Hector MD; 09/04/2020 1:00 PM) The patient is a 49 year old male who presents with colorectal cancer. Note for "Colorectal cancer": ` ` ` Patient sent for surgical consultation at the request of Sean Hernandez  Chief Complaint: Cancer within recurrent polyp of cecum ` ` The patient is a normally gentleman found to have polyps on 49 year old screening colonoscopy.. Underwent removal. Cecal mass felt not amenable to easy endoscopic resection. Internal referral made for endoscopic mucosal resection. This was attempted earlier this month. Not able be fully lifted & removed so aggressive biopsies done. Surgical consultation requested. 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 CEA drawn 1.1. Initial CT scan and pelvis underwhelming. CT of chest concern for a possible liver lesion. MRI of abdomen being scheduled to evaluate possible liver lesion. That is to happen later this week.  Patient comes in by himself. Usually moves his bowels every day or so. No bouts of constipation and diarrhea. His father had colon polyps noted on endoscopies but there is no history of cancer. He's never had any abdominal surgery. Did have a bad ankle fracture requiring right hip iliac crest donation but nothing in the abdomen. Does not smoke. No diabetes. No sleep apnea. No tachycardia, poor issues. On blood thinners. Several miles without difficulty. Has had some heartburn or reflux issues stable on proton pump inhibitor. Seasonal allergies.  No personal nor family history of GI/colon cancer, inflammatory bowel disease, irritable bowel syndrome, allergy such as Celiac Sprue, dietary/dairy problems, colitis, ulcers nor gastritis. No recent sick  contacts/gastroenteritis. No travel outside the country. No changes in diet. No dysphagia to solids or liquids. No significant heartburn or reflux. No melena, hematemesis, coffee ground emesis. No evidence of prior gastric/peptic ulceration.  (Review of systems as stated in this history (HPI) or in the review of systems. Otherwise all other 12 point ROS are negative) ` `  SURGICAL PATHOLOGY * THIS IS AN ADDENDUM REPORT * CASE: [id number] PATIENT: Sean Hernandez Surgical Pathology Report Addendum   Reason for Addendum #1: DNA Mismatch Repair IHC Results  Clinical History: cecal polyp, R/O malignancy (cm)     FINAL MICROSCOPIC DIAGNOSIS:  A. COLON, CECUM, POLYPECTOMY: - Adenocarcinoma.  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 MMR will be ordered. Dr. Vic Ripper has reviewed the case. Dr. Rush Landmark was paged on 08/28/2020.   Chalmer Zheng DESCRIPTION:  Received in formalin are tan-pink soft tissue fragments that are submitted in toto. Number: Multiple. Size: 1.2 x 1 x 0.2 cm in aggregate. Blocks: 1  SW 08/27/2020    Final Diagnosis performed by Vicente Males, MD. Electronically signed 08/28/2020 Technical component performed at [facility]. [facility], [place] [address] [place], [place] [address]. Professional component performed at [facility], [place] [address], [place], [place] [address]. Immunohistochemistry Technical component (if applicable) was performed at [facility]. [address], [place], [place], [place] [address]. IMMUNOHISTOCHEMISTRY DISCLAIMER (if applicable): Some of these immunohistochemical stains may have been developed and the performance characteristics determine by [facility]. Some may not have been cleared or approved by the U.S. Food and Drug Administration. The FDA has determined that such clearance or approval is not necessary.  This test is used for clinical purposes. It should not be regarded as investigational or for research.  This laboratory is certified under the [place] (CLIA-88) as qualified to perform high complexity clinical laboratory testing. The controls stained appropriately.     ADDENDUM:  Mismatch Repair Protein (IHC)  SUMMARY INTERPRETATION: NORMAL  There is preserved expression of the major MMR proteins. There is a very low probability that microsatellite instability (MSI) is present. However, certain clinically significant MMR protein mutations may result in preservation of nuclear expression. It is recommended that the preservation of protein expression be correlated with molecular based MSI testing.  IHC EXPRESSION RESULTS  TEST RESULT MLH1: Preserved nuclear expression MSH2: Preserved nuclear expression MSH6: Preserved nuclear expression PMS2: Preserved nuclear expression  References: 1. Guidelines on Genetic Evaluation and Management of Lynch Syndrome: A Consensus Statement by the [place] Multi-Society Task Force on Colorectal Cancer Gae Dry. Sherlie Ban , MD, and other . Am Nicki [place] 2014; [phone]; doi: 10.1038/ajg.2014.186; published online 31 May 2013 2. Outcomes of screening endometrial cancer patients for Lynch syndrome by patient-administered checklist. Olena Heckle MS, and others. Gynecol Oncol 2013;131(3):619-623. 3. Muir-Torre syndrome (MTS): An update and approach to diagnosis and management. Shelly Flatten, MD and others. J Am Acad Dermatol [phone]         Addendum #1 performed by Jaquita Folds, MD. Electronically signed 08/30/2020 Technical component performed at [facility]. [facility], [place] [address] [place], [place] [address]. Professional component performed at [facility], [place] [address], [place], [place]  [address]. Immunohistochemistry Technical component (if applicable) was performed at [facility]. [address] [place], [place], [place] [address]. IMMUNOHISTOCHEMISTRY DISCLAIMER (if applicable): Some of these immunohistochemical stains may have been developed and the performance characteristics determine by [facility]. Some may not have been cleared or approved by the U.S. Food and Drug Administration. The FDA has determined that such clearance or approval is not necessary. This test is used for clinical purposes. It should not be regarded as investigational or for research. This laboratory is certified under the [place] (CLIA-88) as qualified to perform high complexity clinical laboratory testing. The controls stained appropriately.  ###########################################`  This patient encounter took 45 minutes today to perform the following: obtain history, perform exam, review outside records, interpret tests & imaging, counsel the patient on their diagnosis; and, document this encounter, including findings & plan in the electronic health record (EHR).   Past Surgical History Darden Palmer, [place]; 09/04/2020 11:57 AM) Colon Polyp Removal - Colonoscopy Foot Surgery Right. Hip Surgery Right. Oral Surgery  Diagnostic Studies History Darden Palmer, [place]; 09/04/2020 11:57 AM) Colonoscopy within last year  Allergies Darden Palmer, RMA; 09/04/2020 11:59 AM) Iodine *ANTISEPTICS & DISINFECTANTS* Contrast Media Ready-Box *MEDICAL DEVICES AND SUPPLIES* Allergies Reconciled  Medication History Darden Palmer, RMA; 09/04/2020 12:00 PM) Protonix (Oral) Specific strength unknown - Active. ZyrTEC (Oral) Specific strength unknown - Active. Medications Reconciled  Social History Darden Palmer, [place]; 09/04/2020 11:58 AM) Alcohol use Moderate alcohol use. Caffeine use Carbonated beverages,  Coffee, Tea. Illicit drug use Remotely quit drug use. Tobacco use Never smoker.  Family History Darden Palmer, [place]; 09/04/2020 11:57 AM) Colon Polyps Father, Mother. Depression Daughter. Hypertension Father, Mother. Respiratory Condition Father.  Other Problems Darden Palmer, [place]; 09/04/2020 11:57 AM) Back Pain Colon Cancer Gastroesophageal Reflux Disease     Review of Systems Darden Palmer RMA; 09/04/2020 11:58 AM) General Not Present- Appetite Loss, Chills, Fatigue, Fever, Night Sweats, Weight Gain and Weight Loss. Skin Not Present- Change in Wart/Mole, Dryness, Hives, Jaundice, New  Lesions, Non-Healing Wounds, Rash and Ulcer. HEENT Present- Seasonal Allergies and Wears glasses/contact lenses. Not Present- Earache, Hearing Loss, Hoarseness, Nose Bleed, Oral Ulcers, Ringing in the Ears, Sinus Pain, Sore Throat, Visual Disturbances and Yellow Eyes. Respiratory Not Present- Bloody sputum, Chronic Cough, Difficulty Breathing, Snoring and Wheezing. Breast Not Present- Breast Mass, Breast Pain, Nipple Discharge and Skin Changes. Cardiovascular Not Present- Chest Pain, Difficulty Breathing Lying Down, Leg Cramps, Palpitations, Rapid Heart Rate, Shortness of Breath and Swelling of Extremities. Gastrointestinal Present- Difficulty Swallowing. Not Present- Abdominal Pain, Bloating, Bloody Stool, Change in Bowel Habits, Chronic diarrhea, Constipation, Excessive gas, Gets full quickly at meals, Hemorrhoids, Indigestion, Nausea, Rectal Pain and Vomiting. Male Genitourinary Not Present- Blood in Urine, Change in Urinary Stream, Frequency, Impotence, Nocturia, Painful Urination, Urgency and Urine Leakage. Musculoskeletal Present- Back Pain. Not Present- Joint Pain, Joint Stiffness, Muscle Pain, Muscle Weakness and Swelling of Extremities. Neurological Not Present- Decreased Memory, Fainting, Headaches, Numbness, Seizures, Tingling, Tremor, Trouble walking and Weakness. Psychiatric Not  Present- Anxiety, Bipolar, Change in Sleep Pattern, Depression, Fearful and Frequent crying. Endocrine Not Present- Cold Intolerance, Excessive Hunger, Hair Changes, Heat Intolerance, Hot flashes and New Diabetes. Hematology Not Present- Blood Thinners, Easy Bruising, Excessive bleeding, Gland problems, HIV and Persistent Infections.  Vitals Lattie Haw Caldwell RMA; 09/04/2020 12:00 PM) 09/04/2020 12:00 PM Weight: 168.38 lb Height: 67in Body Surface Area: 1.88 m Body Mass Index: 26.37 kg/m  Temp.: 97.75F  Pulse: 65 (Regular)  P.OX: 98% (Room air) BP: 124/84(Sitting, Left Arm, Standard)        Physical Exam Sean Hector MD; 09/04/2020 12:26 PM)  General Mental Status-Alert. General Appearance-Not in acute distress, Not Sickly. Orientation-Oriented X3. Hydration-Well hydrated. Voice-Normal.  Integumentary Global Assessment Upon inspection and palpation of skin surfaces of the - Axillae: non-tender, no inflammation or ulceration, no drainage. and Distribution of scalp and body hair is normal. General Characteristics Temperature - normal warmth is noted.  Head and Neck Head-normocephalic, atraumatic with no lesions or palpable masses. Face Global Assessment - atraumatic, no absence of expression. Neck Global Assessment - no abnormal movements, no bruit auscultated on the right, no bruit auscultated on the left, no decreased range of motion, non-tender. Trachea-midline. Thyroid Gland Characteristics - non-tender.  Eye Eyeball - Left-Extraocular movements intact, No Nystagmus - Left. Eyeball - Right-Extraocular movements intact, No Nystagmus - Right. Cornea - Left-No Hazy - Left. Cornea - Right-No Hazy - Right. Sclera/Conjunctiva - Left-No scleral icterus, No Discharge - Left. Sclera/Conjunctiva - Right-No scleral icterus, No Discharge - Right. Pupil - Left-Direct reaction to light normal. Pupil - Right-Direct reaction to light  normal.  ENMT Ears Pinna - Left - no drainage observed, no generalized tenderness observed. Pinna - Right - no drainage observed, no generalized tenderness observed. Nose and Sinuses External Inspection of the Nose - no destructive lesion observed. Inspection of the nares - Left - quiet respiration. Inspection of the nares - Right - quiet respiration. Mouth and Throat Lips - Upper Lip - no fissures observed, no pallor noted. Lower Lip - no fissures observed, no pallor noted. Nasopharynx - no discharge present. Oral Cavity/Oropharynx - Tongue - no dryness observed. Oral Mucosa - no cyanosis observed. Hypopharynx - no evidence of airway distress observed.  Chest and Lung Exam Inspection Movements - Normal and Symmetrical. Accessory muscles - No use of accessory muscles in breathing. Palpation Palpation of the chest reveals - Non-tender. Auscultation Breath sounds - Normal and Clear.  Cardiovascular Auscultation Rhythm - Regular. Murmurs & Other Heart Sounds - Auscultation  of the heart reveals - No Murmurs and No Systolic Clicks.  Abdomen Inspection Inspection of the abdomen reveals - No Visible peristalsis and No Abnormal pulsations. Umbilicus - No Bleeding, No Urine drainage. Palpation/Percussion Palpation and Percussion of the abdomen reveal - Soft, Non Tender, No Rebound tenderness, No Rigidity (guarding) and No Cutaneous hyperesthesia. Note: Abdomen soft. Nontender. Not distended. No umbilical or incisional hernias. No guarding.  Male Genitourinary Sexual Maturity Tanner 5 - Adult hair pattern and Adult penile size and shape.  Peripheral Vascular Upper Extremity Inspection - Left - No Cyanotic nailbeds - Left, Not Ischemic. Inspection - Right - No Cyanotic nailbeds - Right, Not Ischemic.  Neurologic Neurologic evaluation reveals -normal attention span and ability to concentrate, able to name objects and repeat phrases. Appropriate fund of knowledge , normal sensation  and normal coordination. Mental Status Affect - not angry, not paranoid. Cranial Nerves-Normal Bilaterally. Gait-Normal.  Neuropsychiatric Mental status exam performed with findings of-able to articulate well with normal speech/language, rate, volume and coherence, thought content normal with ability to perform basic computations and apply abstract reasoning and no evidence of hallucinations, delusions, obsessions or homicidal/suicidal ideation.  Musculoskeletal Global Assessment Spine, Ribs and Pelvis - no instability, subluxation or laxity. Right Upper Extremity - no instability, subluxation or laxity.  Lymphatic Head & Neck  General Head & Neck Lymphatics: Bilateral - Description - No Localized lymphadenopathy. Axillary  General Axillary Region: Bilateral - Description - No Localized lymphadenopathy. Femoral & Inguinal  Generalized Femoral & Inguinal Lymphatics: Left - Description - No Localized lymphadenopathy. Right - Description - No Localized lymphadenopathy.    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 Not able to be removed by endoscopic mucosal resection in October with biopsy showing adenocarcinoma.  Standard of care is segmental colonic resection. Good candidate for robotic resection with intracorporeal anastomosis with enhance recovery pathway.  Patient had many appropriate questions. I worked answer them. He understands. He agrees with planning surgery. We are getting into 3 months since diagnosis, so I do not want to wait too long. He would like to be aggressive as well.  Concern of a liver lesion on CAT scan of the chest although interestingly not noted on CT of abdomen a month earlier. MRI of abdomen planned in a few days. If that does not show any evidence of metastatic disease, proceed with surgical resection. If suspicious, I would suspect he will  need interventional radiology biopsy of the lesion.  If there is metastatic disease then discuss with medical oncology and then she had tumor board about chemotherapy first   PREOP COLON - ENCOUNTER FOR PREOPERATIVE EXAMINATION FOR GENERAL SURGICAL PROCEDURE (Z01.818)  Current Plans You are being scheduled for surgery- Our schedulers will call you.  You should hear from our office's scheduling department within 5 working days about the location, date, and time of surgery. We try to make accommodations for patient's preferences in scheduling surgery, but sometimes the OR schedule or the surgeon's schedule prevents [place] from making those accommodations.  If you have not heard from our office [phone]) in 5 working days, call the office and ask for your surgeon's nurse.  If you have other questions about your diagnosis, plan, or surgery, call the office and ask for your surgeon's nurse.  Written instructions provided The anatomy & physiology of the digestive tract was discussed. The pathophysiology of the colon was discussed. Natural history risks  without surgery was discussed. I feel the risks of no intervention will lead to serious problems that outweigh the operative risks; therefore, I recommended a partial colectomy to remove the pathology. Minimally invasive (Robotic/Laparoscopic) & open techniques were discussed.  Risks such as bleeding, infection, abscess, leak, reoperation, possible ostomy, hernia, heart attack, death, and other risks were discussed. I noted a good likelihood this will help address the problem. Goals of post-operative recovery were discussed as well. Need for adequate nutrition, daily bowel regimen and healthy physical activity, to optimize recovery was noted as well. We will work to minimize complications. Educational materials were available as well. Questions were answered. The patient expresses understanding & wishes to proceed with surgery.  Pt  Education - [facility] Colon Bowel Prep 2018 ERAS/Miralax/Antibiotics Started Neomycin Sulfate 500 MG Oral Tablet, 2 (two) Tablet SEE NOTE, #6, 09/04/2020, No Refill. Local Order: Pharmacist Notes: TAKE TWO TABLETS AT 2 PM, 3 PM, AND 10 PM THE DAY PRIOR TO SURGERY Started Flagyl 500 MG Oral Tablet, 2 (two) Tablet SEE NOTE, #6, 09/04/2020, No Refill. Local Order: Pharmacist Notes: Take at 2pm, 3pm, and 10pm the day prior to your colon operation Pt Education - Pamphlet Given - Laparoscopic Colorectal Surgery: discussed with patient and provided information. Pt Education - [facility] Colectomy post-op instructions: discussed with patient and provided information.  LIVER MASS (R16.0) Impression: Concern of 1 cm lesion on left hepatic lobe on CT of chest. MRI pending later this week. If concerning, most likely needs interventional radiology biopsy done. If positive for metastatic disease medical oncology and GI tumor board discussion stay discussed need for chemotherapy.  Sean Hector, MD, FACS, MASCRS Gastrointestinal and Minimally Invasive Surgery  [facility] [address], [place], [place] [address] [phone] Fax [phone] Main/Paging  CONTACT INFORMATION: Weekday (9AM-5PM) concerns: Call [facility] main office at [phone] Weeknight (5PM-9AM) or Weekend/Holiday concerns: Check [email or web address] for General Surgery [facility] coverage (Please, do not use SecureChat as it is not reliable communication to operating surgeons for immediate patient care)

## 2020-09-07 ENCOUNTER — Telehealth: Payer: Self-pay | Admitting: Gastroenterology

## 2020-09-07 ENCOUNTER — Ambulatory Visit (HOSPITAL_COMMUNITY)
Admission: RE | Admit: 2020-09-07 | Discharge: 2020-09-07 | Disposition: A | Discharge status: Home / Self Care | Payer: BC Managed Care – PPO | Source: Ambulatory Visit | Attending: Gastroenterology | Admitting: Gastroenterology

## 2020-09-07 ENCOUNTER — Other Ambulatory Visit: Payer: Self-pay

## 2020-09-07 DIAGNOSIS — K7689 Other specified diseases of liver: Secondary | ICD-10-CM | POA: Diagnosis not present

## 2020-09-07 DIAGNOSIS — K769 Liver disease, unspecified: Secondary | ICD-10-CM | POA: Diagnosis not present

## 2020-09-07 IMAGING — MR MR ABDOMEN W/O CM
10 of 11 series · 46 of 48 positions shown · non-contrast
Comparison: CT chest [DATE]

CLINICAL DATA: Liver lesion seen on CT, for further
characterization.

EXAM:
MRI ABDOMEN WITHOUT CONTRAST
TECHNIQUE: Multiplanar multisequence MR imaging was performed without the
administration of intravenous contrast.

[Series 3: T2 · coronal · 6.0mm · 1.56mm/px · 2 of 28 slices shown]
[im 1/28]
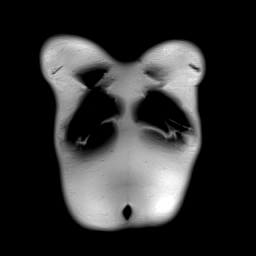
[im 28/28]
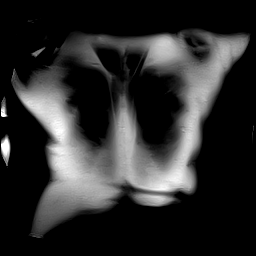

[Series 5: T2 fat-sat · axial · 6.0mm · 1.12mm/px · z∈[-169,+83]mm · 3 of 36 slices shown]
[im 1/36]
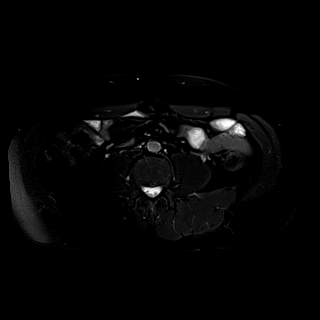
[im 18/36]
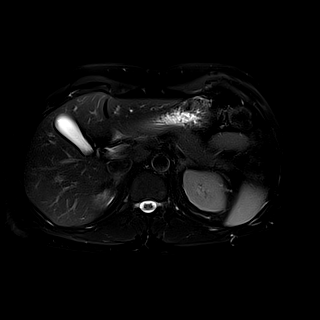
[im 36/36]
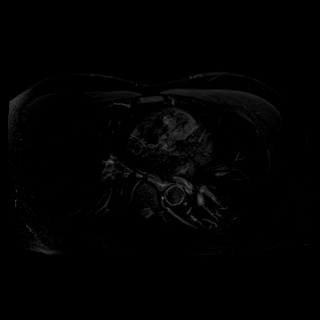

[Series 6: T1 · axial · 3.0mm · 1.12mm/px · z∈[-196,+65]mm · 6 of 88 slices shown (1 of 4)]
[im 1/88]
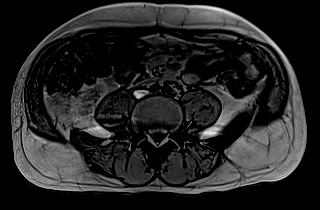
[im 18/88]
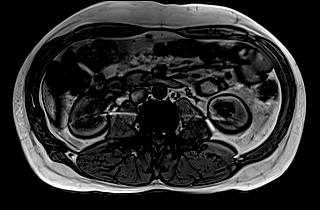
[im 35/88]
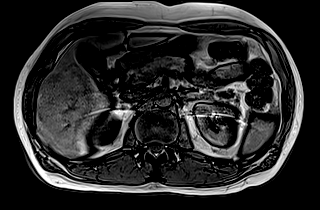
[im 53/88]
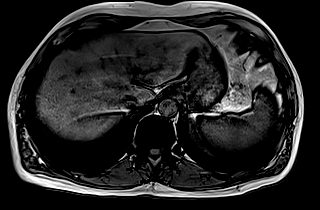
[im 70/88]
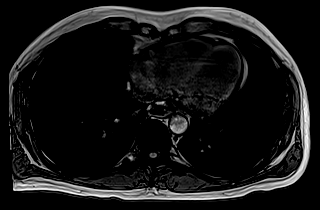
[im 88/88]
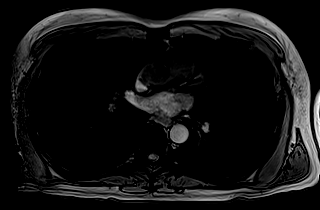

[Series 7: T1 · axial · 3.0mm · 1.12mm/px · z∈[-196,+65]mm · 6 of 88 slices shown (2 of 4)]
[im 1/88]
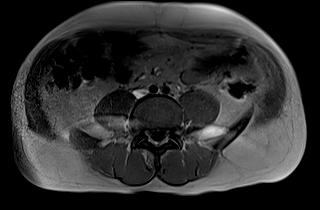
[im 18/88]
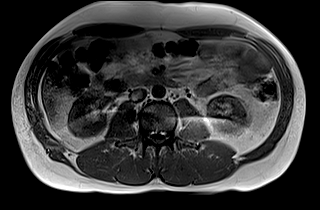
[im 35/88]
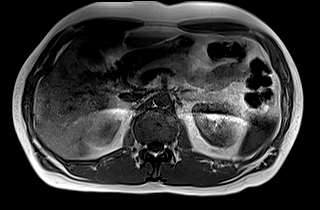
[im 53/88]
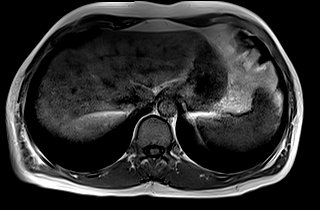
[im 70/88]
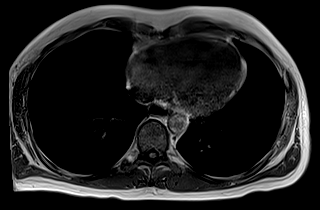
[im 88/88]
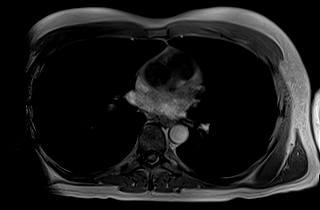

[Series 9: DWI · axial · 6.0mm · 1.36mm/px · z∈[-180,+87]mm · 3 of 38 slices shown]
[im 1/38]
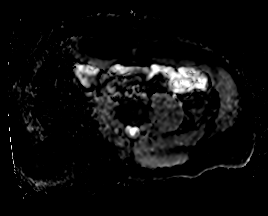
[im 19/38]
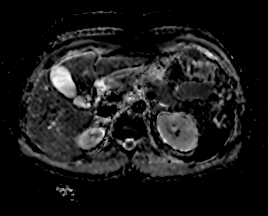
[im 38/38]
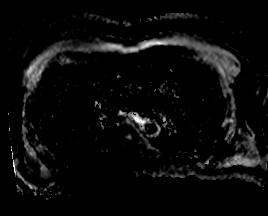

[Series 12: bSSFP · axial · 4.0mm · 0.70mm/px · z∈[-187,+65]mm · 4 of 64 slices shown (1 of 2)]
[im 1/64]
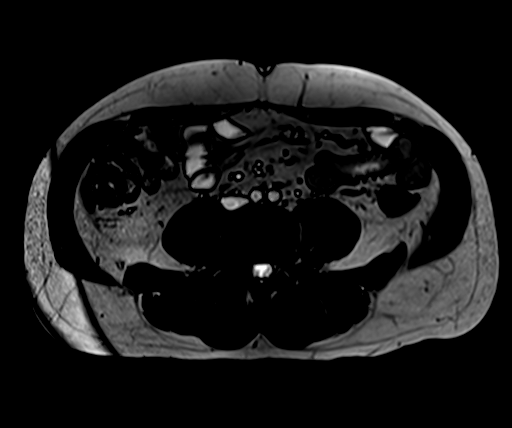
[im 22/64]
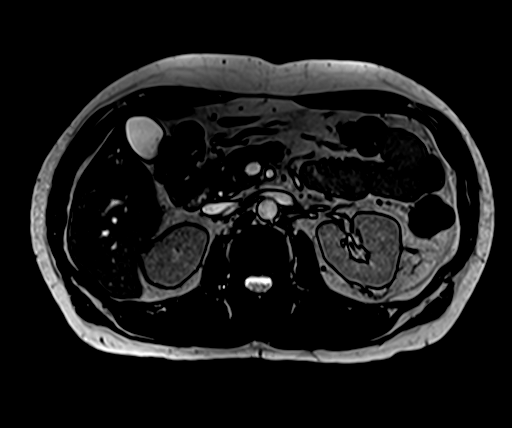
[im 43/64]
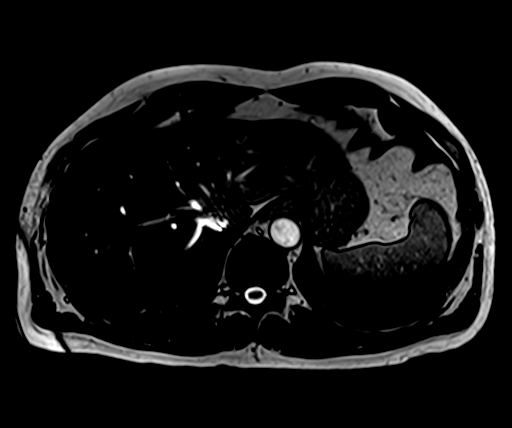
[im 64/64]
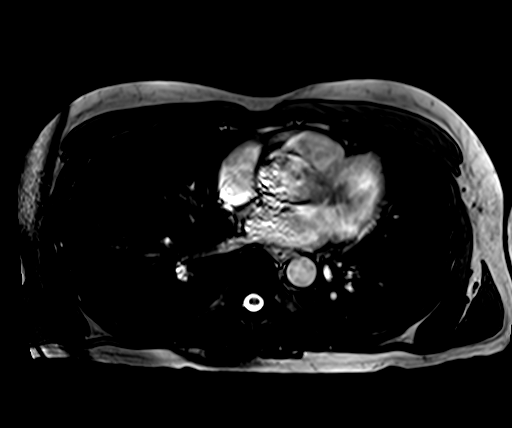

[Series 13: T1 · axial · 3.0mm · 1.09mm/px · z∈[-196,+65]mm · 6 of 88 slices shown (3 of 4)]
[im 1/88]
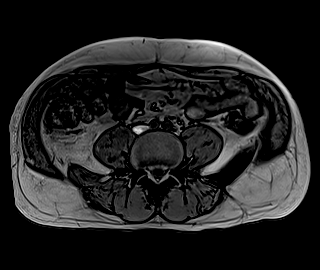
[im 18/88]
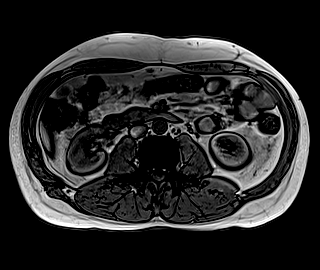
[im 35/88]
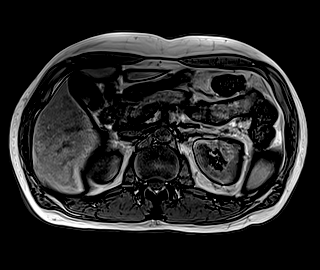
[im 53/88]
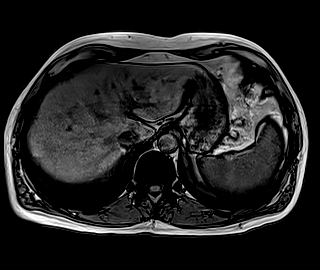
[im 70/88]
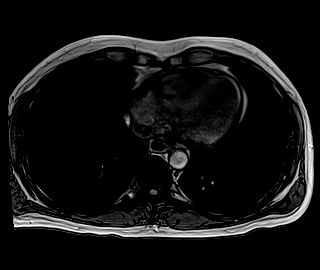
[im 88/88]
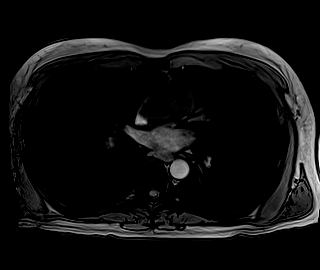

[Series 14: T1 · axial · 3.0mm · 1.09mm/px · z∈[-196,+65]mm · 6 of 88 slices shown (4 of 4)]
[im 1/88]
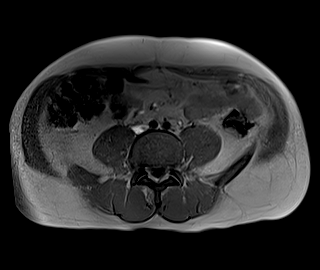
[im 18/88]
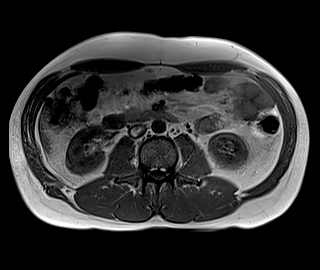
[im 35/88]
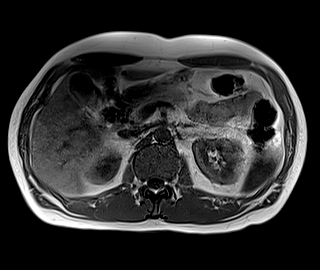
[im 53/88]
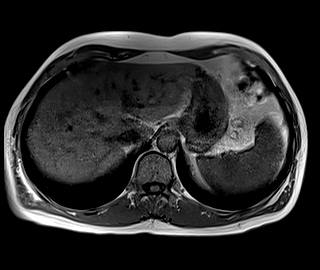
[im 70/88]
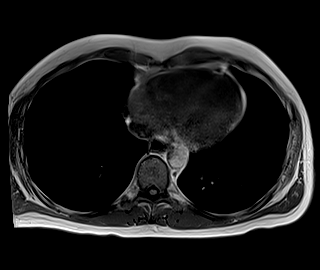
[im 88/88]
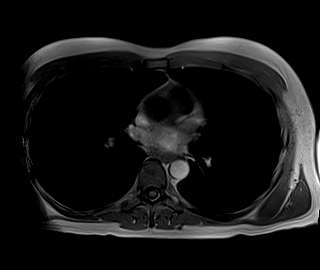

[Series 16: T1 dynamic · axial · 3.0mm · 1.12mm/px · z∈[-188,+73]mm · 6 of 88 slices shown]
[im 1/88]
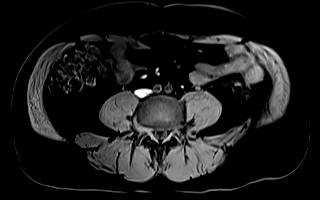
[im 18/88]
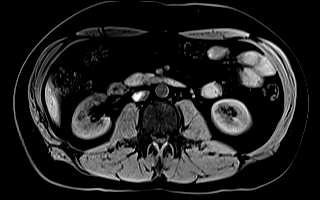
[im 35/88]
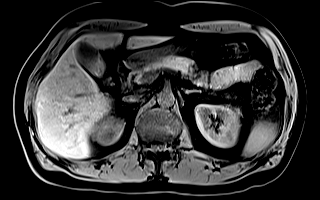
[im 53/88]
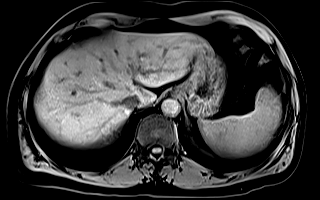
[im 70/88]
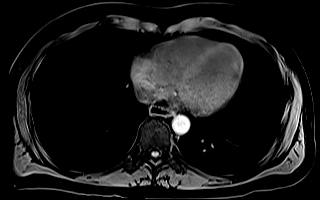
[im 88/88]
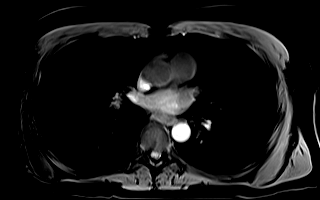

[Series 17: bSSFP · axial · 4.0mm · 0.70mm/px · z∈[-135,+81]mm · 4 of 55 slices shown (2 of 2)]
[im 1/55]
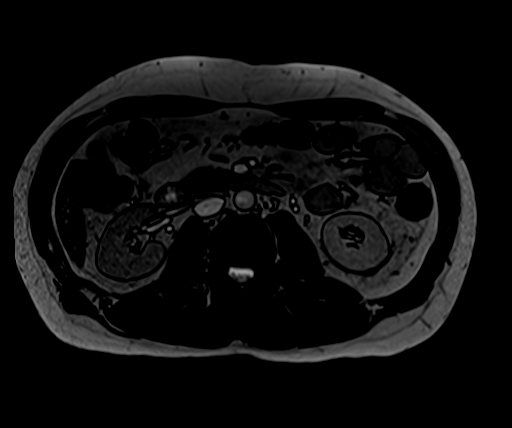
[im 19/55]
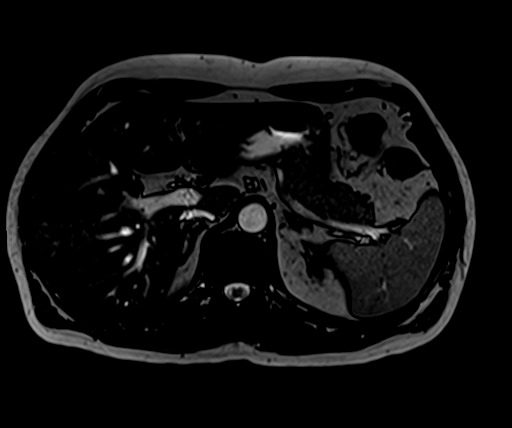
[im 37/55]
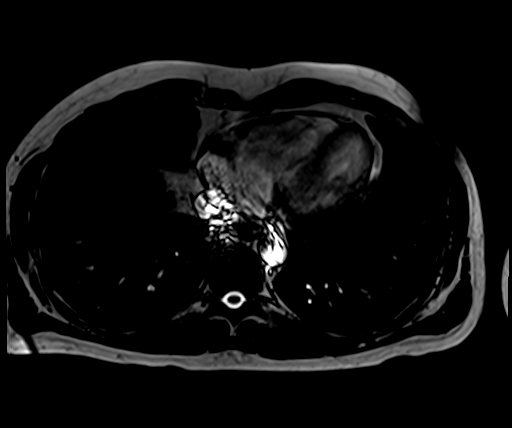
[im 55/55]
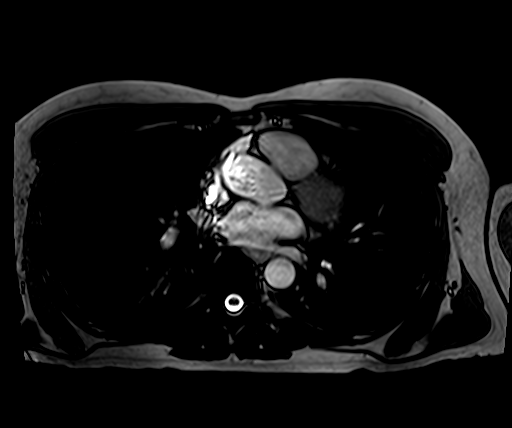

[46 of 48 positions shown; findings below may reference images not displayed]

FINDINGS: Despite efforts by the technologist and patient, motion artifact is
present on today's exam and could not be eliminated. This reduces
exam sensitivity and specificity.

Lower chest: Unremarkable

Hepatobiliary: The lesion of concern in segment 2 of the liver
measures 0.8 by 0.5 by 0.5 cm and has high T2 signal characteristics
and suspected low T1 signal characteristics. Enhancement
characteristics are not assessed today. Nevertheless, this is most
likely to be a tiny cyst or hemangioma.

The gallbladder appears normal.

Pancreas:  Unremarkable

Spleen:  Unremarkable

Adrenals/Urinary Tract:  Unremarkable

Stomach/Bowel: Unremarkable

Vascular/Lymphatic:  Unremarkable

Other:  No supplemental non-categorized findings.

Musculoskeletal: Unremarkable
IMPRESSION: 1. The lesion of concern in segment 2 of the liver measures 0.8 by
0.5 by 0.5 cm, and has high T2 signal characteristics and suspected
low T1 signal characteristics. Very poorly seen due to motion
artifact. Enhancement characteristics are not assessed today. There
is a high likelihood of this representing a benign lesion such as
hemangioma or a small cyst, but if the previously seen lesion along
the cecum is indeed colon cancer, then this small lesion in the
liver should be surveilled by standard follow up CT and/or MRI to
exclude a small/early metastatic deposit.

## 2020-09-07 NOTE — Telephone Encounter (Signed)
I spoke with Marjory Lies at MRI and confirmed order for MRI is correct as ordered

## 2020-09-14 NOTE — Telephone Encounter (Signed)
Noted, thank you

## 2020-09-14 NOTE — Telephone Encounter (Signed)
I spoke to Sean Hernandez today about his manometry results, which I received yesterday from Dr. Silverio Decamp. His dysphagia continues as before.  Fortunately, the study is not consistent with achalasia.  Dr. Silverio Decamp feels that the small hiatal hernia seen on barium study is causing subtle stricture and that this is not a motility problem. Endoscopic dilation recommended.  I explained all this to [facility] and recommended a repeat upper endoscopy with esophageal dilation when he feels ready to do that.  He has a lot going on now with a surgical date scheduled next month for his cecal cancer.  He will consider and send me a portal message about how he would like to proceed, and then I will let you know.

## 2020-10-16 NOTE — Patient Instructions (Addendum)
DUE TO COVID-19 ONLY ONE VISITOR IS ALLOWED TO COME WITH YOU AND STAY IN THE WAITING ROOM ONLY DURING PRE OP AND PROCEDURE DAY OF SURGERY. THE 1 VISITOR  MAY VISIT WITH YOU AFTER SURGERY IN YOUR PRIVATE ROOM DURING VISITING HOURS ONLY!  YOU NEED TO HAVE A COVID 19 TEST ON: 10/20/20`@  10:00 AM , THIS TEST MUST BE DONE BEFORE SURGERY,  COVID TESTING SITE [place] [id number], IT IS ON THE RIGHT GOING OUT [address] APPROXIMATELY  2 MINUTES PAST [employer] ON THE RIGHT. ONCE YOUR COVID TEST IS COMPLETED,  PLEASE BEGIN THE QUARANTINE INSTRUCTIONS AS OUTLINED IN YOUR HANDOUT.                Sean Hernandez   Your procedure is scheduled on: 10/24/20   Report to [facility] Main  Entrance   Report to admitting at: 11:30 AM     Call this number if you have problems the morning of surgery [phone]    Remember: DRINK 2 PRESURGERY ENSURE DRINKS THE NIGHT BEFORE SURGERY AT  1000 PM AND 1 PRESURGERY DRINK THE DAY OF THE PROCEDURE 3 HOURS PRIOR TO SCHEDULED SURGERY. NO SOLIDS AFTER MIDNIGHT THE DAY PRIOR TO THE SURGERY. NOTHING BY MOUTH EXCEPT CLEAR LIQUIDS UNTIL THREE HOURS PRIOR TO SCHEDULED SURGERY( 10:30 AM). PLEASE FINISH PRESURGERY ENSURE DRINK PER SURGEON ORDER 3 HOURS PRIOR TO SCHEDULED SURGERY TIME WHICH NEEDS TO BE COMPLETED AT: 10:30 AM.  CLEAR LIQUID DIET   Foods Allowed                                                                     Foods Excluded  Coffee and tea, regular and decaf                             liquids that you cannot  Plain Jell-O any favor except red or purple                                           see through such as: Fruit ices (not with fruit pulp)                                     milk, soups, orange juice  Iced Popsicles                                    All solid food Carbonated beverages, regular and diet                                    Cranberry, grape and apple juices Sports drinks like  Gatorade Lightly seasoned clear broth or consume(fat free) Sugar, honey syrup  Sample Menu Breakfast  Lunch                                     Supper Cranberry juice                    Beef broth                            Chicken broth Jell-O                                     Grape juice                           Apple juice Coffee or tea                        Jell-O                                      Popsicle                                                Coffee or tea                        Coffee or tea  _____________________________________________________________________  BRUSH YOUR TEETH MORNING OF SURGERY AND RINSE YOUR MOUTH OUT, NO CHEWING GUM CANDY OR MINTS.   Take these medicines the morning of surgery with A SIP OF WATER: Pantoprazole                               You may not have any metal on your body including hair pins and              piercings  Do not wear jewelry, lotions, powders or perfumes, deodorant             Men may shave face and neck.   Do not bring valuables to the hospital. [place].  Contacts, dentures or bridgework may not be worn into surgery.  Leave suitcase in the car. After surgery it may be brought to your room.     Patients discharged the day of surgery will not be allowed to drive home. IF YOU ARE HAVING SURGERY AND GOING HOME THE SAME DAY, YOU MUST HAVE AN ADULT TO DRIVE YOU HOME AND BE WITH YOU FOR 24 HOURS. YOU MAY GO HOME BY TAXI OR UBER OR ORTHERWISE, BUT AN ADULT MUST ACCOMPANY YOU HOME AND STAY WITH YOU FOR 24 HOURS.  Name and phone number of your driver:  Special Instructions: N/A              Please read over the following fact sheets you were given: _____________________________________________________________________         [facility] - Preparing for Surgery Before surgery, you can play an important role.  Because skin is not sterile, your skin  needs to be as free of germs as possible.  You can reduce the number of germs on your skin by washing with CHG (chlorahexidine gluconate) soap before surgery.  CHG is an antiseptic cleaner which kills germs and bonds with the skin to continue killing germs even after washing. Please DO NOT use if you have an allergy to CHG or antibacterial soaps.  If your skin becomes reddened/irritated stop using the CHG and inform your nurse when you arrive at Short Stay. Do not shave (including legs and underarms) for at least 48 hours prior to the first CHG shower.  You may shave your face/neck. Please follow these instructions carefully:  1.  Shower with CHG Soap the night before surgery and the  morning of Surgery.  2.  If you choose to wash your hair, wash your hair first as usual with your  normal  shampoo.  3.  After you shampoo, rinse your hair and body thoroughly to remove the  shampoo.                           4.  Use CHG as you would any other liquid soap.  You can apply chg directly  to the skin and wash                       Gently with a scrungie or clean washcloth.  5.  Apply the CHG Soap to your body ONLY FROM THE NECK DOWN.   Do not use on face/ open                           Wound or open sores. Avoid contact with eyes, ears mouth and genitals (private parts).                       Wash face,  Genitals (private parts) with your normal soap.             6.  Wash thoroughly, paying special attention to the area where your surgery  will be performed.  7.  Thoroughly rinse your body with warm water from the neck down.  8.  DO NOT shower/wash with your normal soap after using and rinsing off  the CHG Soap.                9.  Pat yourself dry with a clean towel.            10.  Wear clean pajamas.            11.  Place clean sheets on your bed the night of your first shower and do not  sleep with pets. Day of Surgery : Do not apply any lotions/deodorants the morning of surgery.  Please wear clean  clothes to the hospital/surgery center.  FAILURE TO FOLLOW THESE INSTRUCTIONS MAY RESULT IN THE CANCELLATION OF YOUR SURGERY PATIENT SIGNATURE_________________________________  NURSE SIGNATURE__________________________________  ________________________________________________________________________   Adam Phenix  An incentive spirometer is a tool that can help keep your lungs clear and active. This tool measures how well you are filling your lungs with each breath. Taking long deep breaths may help reverse or decrease the chance of developing breathing (pulmonary) problems (especially infection) following:  A long period of time when you are unable to move or be active. BEFORE THE PROCEDURE  If the spirometer includes an indicator to show your best effort, your nurse or respiratory therapist will set it to a desired goal.  If possible, sit up straight or lean slightly forward. Try not to slouch.  Hold the incentive spirometer in an upright position. INSTRUCTIONS FOR USE  1. Sit on the edge of your bed if possible, or sit up as far as you can in bed or on a chair. 2. Hold the incentive spirometer in an upright position. 3. Breathe out normally. 4. Place the mouthpiece in your mouth and seal your lips tightly around it. 5. Breathe in slowly and as deeply as possible, raising the piston or the ball toward the top of the column. 6. Hold your breath for 3-5 seconds or for as long as possible. Allow the piston or ball to fall to the bottom of the column. 7. Remove the mouthpiece from your mouth and breathe out normally. 8. Rest for a few seconds and repeat Steps 1 through 7 at least 10 times every 1-2 hours when you are awake. Take your time and take a few normal breaths between deep breaths. 9. The spirometer may include an indicator to show your best effort. Use the indicator as a goal to work toward during each repetition. 10. After each set of 10 deep breaths, practice  coughing to be sure your lungs are clear. If you have an incision (the cut made at the time of surgery), support your incision when coughing by placing a pillow or rolled up towels firmly against it. Once you are able to get out of bed, walk around indoors and cough well. You may stop using the incentive spirometer when instructed by your caregiver.  RISKS AND COMPLICATIONS  Take your time so you do not get dizzy or light-headed.  If you are in pain, you may need to take or ask for pain medication before doing incentive spirometry. It is harder to take a deep breath if you are having pain. AFTER USE  Rest and breathe slowly and easily.  It can be helpful to keep track of a log of your progress. Your caregiver can provide you with a simple table to help with this. If you are using the spirometer at home, follow these instructions: [place] IF:   You are having difficultly using the spirometer.  You have trouble using the spirometer as often as instructed.  Your pain medication is not giving enough relief while using the spirometer.  You develop fever of 100.5 F (38.1 C) or higher. SEEK IMMEDIATE MEDICAL CARE IF:   You cough up bloody sputum that had not been present before.  You develop fever of 102 F (38.9 C) or greater.  You develop worsening pain at or near the incision site. MAKE SURE YOU:   Understand these instructions.  Will watch your condition.  Will get help right away if you are not doing well or get worse. Document Released: 03/09/2007 Document Revised: 01/19/2012 Document Reviewed: 05/10/2007 [facility] Patient Information 2014 [place], [place].   ________________________________________________________________________

## 2020-10-17 ENCOUNTER — Encounter (HOSPITAL_COMMUNITY): Payer: Self-pay

## 2020-10-17 ENCOUNTER — Other Ambulatory Visit: Payer: Self-pay

## 2020-10-17 ENCOUNTER — Encounter (HOSPITAL_COMMUNITY)
Admission: RE | Admit: 2020-10-17 | Discharge: 2020-10-17 | Disposition: A | Discharge status: Home / Self Care | Payer: BC Managed Care – PPO | Source: Ambulatory Visit | Attending: Surgery | Admitting: Surgery

## 2020-10-17 DIAGNOSIS — Z01812 Encounter for preprocedural laboratory examination: Secondary | ICD-10-CM | POA: Insufficient documentation

## 2020-10-17 HISTORY — DX: Other specified postprocedural states: Z98.890

## 2020-10-17 HISTORY — DX: Unspecified asthma, uncomplicated: J45.909

## 2020-10-17 HISTORY — DX: Malignant (primary) neoplasm, unspecified: C80.1

## 2020-10-17 HISTORY — DX: Other complications of anesthesia, initial encounter: T88.59XA

## 2020-10-17 HISTORY — DX: Nausea with vomiting, unspecified: R11.2

## 2020-10-17 LAB — CBC
HCT: 43.1 % (ref 39.0–52.0)
Hemoglobin: 14.6 g/dL (ref 13.0–17.0)
MCH: 31.1 pg (ref 26.0–34.0)
MCHC: 33.9 g/dL (ref 30.0–36.0)
MCV: 91.9 fL (ref 80.0–100.0)
Platelets: 245 10*3/uL (ref 150–400)
RBC: 4.69 MIL/uL (ref 4.22–5.81)
RDW: 13.2 % (ref 11.5–15.5)
WBC: 5.4 10*3/uL (ref 4.0–10.5)
nRBC: 0 % (ref 0.0–0.2)

## 2020-10-17 LAB — HEMOGLOBIN A1C
Hgb A1c MFr Bld: 4.5 % — ABNORMAL LOW (ref 4.8–5.6)
Mean Plasma Glucose: 82.45 mg/dL

## 2020-10-17 NOTE — Progress Notes (Signed)
COVID Vaccine Completed: Yes Date COVID Vaccine completed: 04/16/20 COVID vaccine manufacturer: [place]      PCP - Brunetta Jeans.: [facility] Cardiologist -   Chest x-ray -  EKG -  Stress Test -  ECHO -  Cardiac Cath -  Pacemaker/ICD device last checked:  Sleep Study -  CPAP -   Fasting Blood Sugar -  Checks Blood Sugar _____ times a day  Blood Thinner Instructions: Aspirin Instructions: Last Dose:  Anesthesia review:   Patient denies shortness of breath, fever, cough and chest pain at PAT appointment   Patient verbalized understanding of instructions that were given to them at the PAT appointment. Patient was also instructed that they will need to review over the PAT instructions again at home before surgery.

## 2020-10-19 ENCOUNTER — Telehealth: Payer: Self-pay | Admitting: Gastroenterology

## 2020-10-19 ENCOUNTER — Other Ambulatory Visit: Payer: Self-pay

## 2020-10-19 ENCOUNTER — Telehealth: Payer: Self-pay | Admitting: Physician Assistant

## 2020-10-19 DIAGNOSIS — K2100 Gastro-esophageal reflux disease with esophagitis, without bleeding: Secondary | ICD-10-CM

## 2020-10-19 DIAGNOSIS — R0789 Other chest pain: Secondary | ICD-10-CM

## 2020-10-19 DIAGNOSIS — K222 Esophageal obstruction: Secondary | ICD-10-CM

## 2020-10-19 NOTE — Telephone Encounter (Signed)
Called patient back and stated he had spoken with [facility] and gotten everything taken care of.

## 2020-10-19 NOTE — Telephone Encounter (Signed)
Pt called in stating that [facility] told him he would need to reach out to our office to get his [facility]. Pt wants this script to go to [facility] [place]

## 2020-10-19 NOTE — Telephone Encounter (Signed)
Called [facility] to see what was going on with patient's prescription. Upon speaking with Tye Maryland, she stated the patient has a prescription on file with them as well but picked up a prescription from another location on 10/09/20. Tye Maryland states she will call the patient to see what is going on with his prescription.

## 2020-10-20 ENCOUNTER — Other Ambulatory Visit (HOSPITAL_COMMUNITY)
Admission: RE | Admit: 2020-10-20 | Discharge: 2020-10-20 | Disposition: A | Discharge status: Home / Self Care | Payer: BC Managed Care – PPO | Source: Ambulatory Visit | Attending: Surgery | Admitting: Surgery

## 2020-10-20 DIAGNOSIS — Z01812 Encounter for preprocedural laboratory examination: Secondary | ICD-10-CM | POA: Insufficient documentation

## 2020-10-20 DIAGNOSIS — Z20822 Contact with and (suspected) exposure to covid-19: Secondary | ICD-10-CM | POA: Insufficient documentation

## 2020-10-21 LAB — SARS CORONAVIRUS 2 (TAT 6-24 HRS): SARS Coronavirus 2: NEGATIVE

## 2020-10-23 MED ORDER — BUPIVACAINE LIPOSOME 1.3 % IJ SUSP
20.0000 mL | Freq: Once | INTRAMUSCULAR | Status: DC
  Filled 2020-10-23: qty 20

## 2020-10-24 ENCOUNTER — Inpatient Hospital Stay (HOSPITAL_COMMUNITY): Payer: BC Managed Care – PPO | Admitting: Anesthesiology

## 2020-10-24 ENCOUNTER — Other Ambulatory Visit: Payer: Self-pay

## 2020-10-24 ENCOUNTER — Inpatient Hospital Stay (HOSPITAL_COMMUNITY)
Admission: RE | Admit: 2020-10-24 | Discharge: 2020-10-26 | DRG: 331 | Disposition: A | Discharge status: Home / Self Care | Payer: BC Managed Care – PPO | Attending: Surgery | Admitting: Surgery

## 2020-10-24 ENCOUNTER — Encounter (HOSPITAL_COMMUNITY)
Admission: RE | Disposition: A | Discharge status: Home / Self Care | Payer: Self-pay | Source: Ambulatory Visit | Attending: Surgery

## 2020-10-24 ENCOUNTER — Encounter (HOSPITAL_COMMUNITY): Payer: Self-pay | Admitting: Surgery

## 2020-10-24 DIAGNOSIS — Z8371 Family history of colonic polyps: Secondary | ICD-10-CM | POA: Diagnosis not present

## 2020-10-24 DIAGNOSIS — K7689 Other specified diseases of liver: Secondary | ICD-10-CM | POA: Diagnosis not present

## 2020-10-24 DIAGNOSIS — Z825 Family history of asthma and other chronic lower respiratory diseases: Secondary | ICD-10-CM

## 2020-10-24 DIAGNOSIS — K219 Gastro-esophageal reflux disease without esophagitis: Secondary | ICD-10-CM | POA: Diagnosis present

## 2020-10-24 DIAGNOSIS — Z8249 Family history of ischemic heart disease and other diseases of the circulatory system: Secondary | ICD-10-CM | POA: Diagnosis not present

## 2020-10-24 DIAGNOSIS — C180 Malignant neoplasm of cecum: Principal | ICD-10-CM | POA: Diagnosis present

## 2020-10-24 DIAGNOSIS — J45909 Unspecified asthma, uncomplicated: Secondary | ICD-10-CM | POA: Diagnosis not present

## 2020-10-24 DIAGNOSIS — M1990 Unspecified osteoarthritis, unspecified site: Secondary | ICD-10-CM | POA: Diagnosis not present

## 2020-10-24 DIAGNOSIS — R160 Hepatomegaly, not elsewhere classified: Secondary | ICD-10-CM

## 2020-10-24 SURGERY — COLECTOMY, PARTIAL, ROBOT-ASSISTED, LAPAROSCOPIC
Anesthesia: General | Site: Abdomen

## 2020-10-24 MED ORDER — GABAPENTIN 300 MG PO CAPS
300.0000 mg | ORAL_CAPSULE | ORAL | Status: AC
  Administered 2020-10-24: 12:00:00 300 mg via ORAL
  Filled 2020-10-24: qty 1

## 2020-10-24 MED ORDER — ONDANSETRON HCL 4 MG/2ML IJ SOLN
INTRAMUSCULAR | Status: AC
  Filled 2020-10-24: qty 2

## 2020-10-24 MED ORDER — ORAL CARE MOUTH RINSE: 15.0000 mL | Freq: Once | OROMUCOSAL | Status: AC

## 2020-10-24 MED ORDER — MEPERIDINE HCL 50 MG/ML IJ SOLN: 6.2500 mg | INTRAMUSCULAR | Status: DC | PRN

## 2020-10-24 MED ORDER — DIPHENHYDRAMINE HCL 12.5 MG/5ML PO ELIX: 12.5000 mg | ORAL_SOLUTION | Freq: Four times a day (QID) | ORAL | Status: DC | PRN

## 2020-10-24 MED ORDER — LIDOCAINE 2% (20 MG/ML) 5 ML SYRINGE
INTRAMUSCULAR | Status: DC | PRN
  Administered 2020-10-24: 80 mg via INTRAVENOUS
  Administered 2020-10-24: 1.5 mg/kg/h via INTRAVENOUS

## 2020-10-24 MED ORDER — ENSURE PRE-SURGERY PO LIQD
296.0000 mL | Freq: Once | ORAL | Status: DC
  Filled 2020-10-24: qty 296

## 2020-10-24 MED ORDER — ROCURONIUM BROMIDE 10 MG/ML (PF) SYRINGE
PREFILLED_SYRINGE | INTRAVENOUS | Status: DC | PRN
  Administered 2020-10-24: 100 mg via INTRAVENOUS

## 2020-10-24 MED ORDER — DIPHENHYDRAMINE HCL 50 MG/ML IJ SOLN: 12.5000 mg | Freq: Four times a day (QID) | INTRAMUSCULAR | Status: DC | PRN

## 2020-10-24 MED ORDER — METHOCARBAMOL 500 MG PO TABS: 1000.0000 mg | ORAL_TABLET | Freq: Four times a day (QID) | ORAL | Status: DC | PRN

## 2020-10-24 MED ORDER — ONDANSETRON HCL 4 MG/2ML IJ SOLN: 4.0000 mg | Freq: Four times a day (QID) | INTRAMUSCULAR | Status: DC | PRN

## 2020-10-24 MED ORDER — ENSURE SURGERY PO LIQD
237.0000 mL | Freq: Two times a day (BID) | ORAL | Status: DC
  Administered 2020-10-25 (×2): 237 mL via ORAL

## 2020-10-24 MED ORDER — CHLORHEXIDINE GLUCONATE 0.12 % MT SOLN
15.0000 mL | Freq: Once | OROMUCOSAL | Status: AC
  Administered 2020-10-24: 12:00:00 15 mL via OROMUCOSAL

## 2020-10-24 MED ORDER — METOPROLOL TARTRATE 5 MG/5ML IV SOLN: 5.0000 mg | Freq: Four times a day (QID) | INTRAVENOUS | Status: DC | PRN

## 2020-10-24 MED ORDER — METHOCARBAMOL 1000 MG/10ML IJ SOLN
1000.0000 mg | Freq: Four times a day (QID) | INTRAVENOUS | Status: DC | PRN
  Filled 2020-10-24: qty 10

## 2020-10-24 MED ORDER — TRAMADOL HCL 50 MG PO TABS: 50.0000 mg | ORAL_TABLET | Freq: Four times a day (QID) | ORAL | 0 refills | Status: DC | PRN

## 2020-10-24 MED ORDER — SUGAMMADEX SODIUM 200 MG/2ML IV SOLN
INTRAVENOUS | Status: DC | PRN
  Administered 2020-10-24: 190 mg via INTRAVENOUS

## 2020-10-24 MED ORDER — PANTOPRAZOLE SODIUM 40 MG PO TBEC
40.0000 mg | DELAYED_RELEASE_TABLET | Freq: Every day | ORAL | Status: DC
  Administered 2020-10-25 – 2020-10-26 (×2): 40 mg via ORAL
  Filled 2020-10-24 (×2): qty 1

## 2020-10-24 MED ORDER — KETAMINE HCL 10 MG/ML IJ SOLN
INTRAMUSCULAR | Status: DC | PRN
  Administered 2020-10-24 (×3): 10 mg via INTRAVENOUS

## 2020-10-24 MED ORDER — FENTANYL CITRATE (PF) 100 MCG/2ML IJ SOLN
INTRAMUSCULAR | Status: DC | PRN
  Administered 2020-10-24 (×2): 25 ug via INTRAVENOUS
  Administered 2020-10-24 (×2): 50 ug via INTRAVENOUS

## 2020-10-24 MED ORDER — OXYCODONE HCL 5 MG/5ML PO SOLN: 5.0000 mg | Freq: Once | ORAL | Status: DC | PRN

## 2020-10-24 MED ORDER — HYDROMORPHONE HCL 1 MG/ML IJ SOLN
INTRAMUSCULAR | Status: AC
  Filled 2020-10-24: qty 1

## 2020-10-24 MED ORDER — SODIUM CHLORIDE (PF) 0.9 % IJ SOLN
INTRAMUSCULAR | Status: AC
  Filled 2020-10-24: qty 50

## 2020-10-24 MED ORDER — ENOXAPARIN SODIUM 40 MG/0.4ML ~~LOC~~ SOLN
40.0000 mg | Freq: Once | SUBCUTANEOUS | Status: AC
  Administered 2020-10-24: 12:00:00 40 mg via SUBCUTANEOUS
  Filled 2020-10-24: qty 0.4

## 2020-10-24 MED ORDER — SODIUM CHLORIDE 0.9 % IV SOLN
2.0000 g | INTRAVENOUS | Status: AC
  Administered 2020-10-24: 13:00:00 2 g via INTRAVENOUS
  Filled 2020-10-24: qty 2

## 2020-10-24 MED ORDER — LIDOCAINE HCL (PF) 2 % IJ SOLN
INTRAMUSCULAR | Status: AC
  Filled 2020-10-24: qty 5

## 2020-10-24 MED ORDER — LACTATED RINGERS IV SOLN: INTRAVENOUS | Status: DC

## 2020-10-24 MED ORDER — POLYETHYLENE GLYCOL 3350 17 GM/SCOOP PO POWD: 1.0000 | Freq: Once | ORAL | Status: DC

## 2020-10-24 MED ORDER — ALUM & MAG HYDROXIDE-SIMETH 200-200-20 MG/5ML PO SUSP: 30.0000 mL | Freq: Four times a day (QID) | ORAL | Status: DC | PRN

## 2020-10-24 MED ORDER — FENTANYL CITRATE (PF) 100 MCG/2ML IJ SOLN
INTRAMUSCULAR | Status: AC
  Filled 2020-10-24: qty 2

## 2020-10-24 MED ORDER — HYDROMORPHONE HCL 1 MG/ML IJ SOLN
0.2500 mg | INTRAMUSCULAR | Status: DC | PRN
  Administered 2020-10-24 (×4): 0.5 mg via INTRAVENOUS

## 2020-10-24 MED ORDER — BUPIVACAINE LIPOSOME 1.3 % IJ SUSP
INTRAMUSCULAR | Status: DC | PRN
  Administered 2020-10-24: 20 mL

## 2020-10-24 MED ORDER — BUPIVACAINE-EPINEPHRINE 0.5% -1:200000 IJ SOLN
INTRAMUSCULAR | Status: DC | PRN
  Administered 2020-10-24: 30 mL

## 2020-10-24 MED ORDER — MIDAZOLAM HCL 5 MG/5ML IJ SOLN
INTRAMUSCULAR | Status: DC | PRN
  Administered 2020-10-24: 2 mg via INTRAVENOUS

## 2020-10-24 MED ORDER — SODIUM CHLORIDE 0.9 % IV SOLN: Freq: Three times a day (TID) | INTRAVENOUS | Status: DC | PRN

## 2020-10-24 MED ORDER — SODIUM CHLORIDE 0.9 % IV SOLN: 250.0000 mL | INTRAVENOUS | Status: DC | PRN

## 2020-10-24 MED ORDER — ONDANSETRON HCL 4 MG PO TABS: 4.0000 mg | ORAL_TABLET | Freq: Four times a day (QID) | ORAL | Status: DC | PRN

## 2020-10-24 MED ORDER — PROCHLORPERAZINE MALEATE 10 MG PO TABS
10.0000 mg | ORAL_TABLET | Freq: Four times a day (QID) | ORAL | Status: DC | PRN
  Filled 2020-10-24: qty 1

## 2020-10-24 MED ORDER — PROPOFOL 10 MG/ML IV BOLUS
INTRAVENOUS | Status: DC | PRN
  Administered 2020-10-24: 200 mg via INTRAVENOUS

## 2020-10-24 MED ORDER — NEOMYCIN SULFATE 500 MG PO TABS: 1000.0000 mg | ORAL_TABLET | ORAL | Status: DC

## 2020-10-24 MED ORDER — BUPIVACAINE-EPINEPHRINE (PF) 0.5% -1:200000 IJ SOLN
INTRAMUSCULAR | Status: AC
  Filled 2020-10-24: qty 30

## 2020-10-24 MED ORDER — SODIUM CHLORIDE 0.9% FLUSH
3.0000 mL | Freq: Two times a day (BID) | INTRAVENOUS | Status: DC
  Administered 2020-10-25 (×2): 3 mL via INTRAVENOUS

## 2020-10-24 MED ORDER — ONDANSETRON HCL 4 MG/2ML IJ SOLN
INTRAMUSCULAR | Status: DC | PRN
  Administered 2020-10-24: 4 mg via INTRAVENOUS

## 2020-10-24 MED ORDER — ENOXAPARIN SODIUM 40 MG/0.4ML ~~LOC~~ SOLN
40.0000 mg | SUBCUTANEOUS | Status: DC
  Administered 2020-10-25 – 2020-10-26 (×2): 40 mg via SUBCUTANEOUS
  Filled 2020-10-24 (×2): qty 0.4

## 2020-10-24 MED ORDER — SODIUM CHLORIDE 0.9 % IV SOLN
2.0000 g | Freq: Two times a day (BID) | INTRAVENOUS | Status: AC
  Administered 2020-10-25: 02:00:00 2 g via INTRAVENOUS
  Filled 2020-10-24: qty 2

## 2020-10-24 MED ORDER — ROCURONIUM BROMIDE 10 MG/ML (PF) SYRINGE
PREFILLED_SYRINGE | INTRAVENOUS | Status: AC
  Filled 2020-10-24: qty 10

## 2020-10-24 MED ORDER — ACETAMINOPHEN 500 MG PO TABS: 1000.0000 mg | ORAL_TABLET | Freq: Once | ORAL | Status: DC

## 2020-10-24 MED ORDER — PROMETHAZINE HCL 25 MG/ML IJ SOLN: 6.2500 mg | INTRAMUSCULAR | Status: DC | PRN

## 2020-10-24 MED ORDER — OXYCODONE HCL 5 MG PO TABS: 5.0000 mg | ORAL_TABLET | Freq: Once | ORAL | Status: DC | PRN

## 2020-10-24 MED ORDER — PSYLLIUM 95 % PO PACK
1.0000 | PACK | Freq: Every day | ORAL | Status: DC
  Administered 2020-10-25 – 2020-10-26 (×2): 1 via ORAL
  Filled 2020-10-24 (×2): qty 1

## 2020-10-24 MED ORDER — ALVIMOPAN 12 MG PO CAPS
12.0000 mg | ORAL_CAPSULE | ORAL | Status: AC
  Administered 2020-10-24: 12:00:00 12 mg via ORAL
  Filled 2020-10-24: qty 1

## 2020-10-24 MED ORDER — PROPOFOL 10 MG/ML IV BOLUS
INTRAVENOUS | Status: AC
  Filled 2020-10-24: qty 20

## 2020-10-24 MED ORDER — HYDROMORPHONE HCL 1 MG/ML IJ SOLN: 0.5000 mg | INTRAMUSCULAR | Status: DC | PRN

## 2020-10-24 MED ORDER — LIP MEDEX EX OINT
1.0000 "application " | TOPICAL_OINTMENT | Freq: Two times a day (BID) | CUTANEOUS | Status: DC
  Administered 2020-10-25 – 2020-10-26 (×3): 1 via TOPICAL
  Filled 2020-10-24: qty 7

## 2020-10-24 MED ORDER — ALVIMOPAN 12 MG PO CAPS
12.0000 mg | ORAL_CAPSULE | Freq: Two times a day (BID) | ORAL | Status: DC
  Administered 2020-10-25: 10:00:00 12 mg via ORAL
  Filled 2020-10-24 (×2): qty 1

## 2020-10-24 MED ORDER — 0.9 % SODIUM CHLORIDE (POUR BTL) OPTIME
TOPICAL | Status: DC | PRN
  Administered 2020-10-24: 14:00:00 2000 mL

## 2020-10-24 MED ORDER — ACETAMINOPHEN 500 MG PO TABS
1000.0000 mg | ORAL_TABLET | Freq: Four times a day (QID) | ORAL | Status: DC
  Administered 2020-10-24 – 2020-10-26 (×6): 1000 mg via ORAL
  Filled 2020-10-24 (×7): qty 2

## 2020-10-24 MED ORDER — LACTATED RINGERS IR SOLN
Status: DC | PRN
Start: 2020-10-24 — End: 2020-10-24
  Administered 2020-10-24: 1000 mL

## 2020-10-24 MED ORDER — LIDOCAINE HCL 2 % IJ SOLN
INTRAMUSCULAR | Status: AC
  Filled 2020-10-24: qty 20

## 2020-10-24 MED ORDER — MAGIC MOUTHWASH
15.0000 mL | Freq: Four times a day (QID) | ORAL | Status: DC | PRN
  Filled 2020-10-24: qty 15

## 2020-10-24 MED ORDER — ENSURE PRE-SURGERY PO LIQD
592.0000 mL | Freq: Once | ORAL | Status: DC
  Filled 2020-10-24: qty 592

## 2020-10-24 MED ORDER — SCOPOLAMINE 1 MG/3DAYS TD PT72
1.0000 | MEDICATED_PATCH | TRANSDERMAL | Status: DC
  Administered 2020-10-24: 12:00:00 1.5 mg via TRANSDERMAL
  Filled 2020-10-24: qty 1

## 2020-10-24 MED ORDER — SODIUM CHLORIDE (PF) 0.9 % IJ SOLN
INTRAMUSCULAR | Status: DC | PRN
  Administered 2020-10-24: 30 mL

## 2020-10-24 MED ORDER — SODIUM CHLORIDE 0.9% FLUSH: 3.0000 mL | INTRAVENOUS | Status: DC | PRN

## 2020-10-24 MED ORDER — TRAMADOL HCL 50 MG PO TABS
50.0000 mg | ORAL_TABLET | Freq: Four times a day (QID) | ORAL | Status: DC | PRN
  Administered 2020-10-25 (×2): 50 mg via ORAL
  Filled 2020-10-24 (×2): qty 1

## 2020-10-24 MED ORDER — BISACODYL 5 MG PO TBEC: 20.0000 mg | DELAYED_RELEASE_TABLET | Freq: Once | ORAL | Status: DC

## 2020-10-24 MED ORDER — MIDAZOLAM HCL 2 MG/2ML IJ SOLN
INTRAMUSCULAR | Status: AC
  Filled 2020-10-24: qty 2

## 2020-10-24 MED ORDER — PROCHLORPERAZINE EDISYLATE 10 MG/2ML IJ SOLN: 5.0000 mg | Freq: Four times a day (QID) | INTRAMUSCULAR | Status: DC | PRN

## 2020-10-24 MED ORDER — ACETAMINOPHEN 500 MG PO TABS
1000.0000 mg | ORAL_TABLET | ORAL | Status: AC
  Administered 2020-10-24: 12:00:00 1000 mg via ORAL
  Filled 2020-10-24: qty 2

## 2020-10-24 MED ORDER — GABAPENTIN 100 MG PO CAPS
200.0000 mg | ORAL_CAPSULE | Freq: Three times a day (TID) | ORAL | Status: DC
  Administered 2020-10-24 – 2020-10-26 (×5): 200 mg via ORAL
  Filled 2020-10-24 (×5): qty 2

## 2020-10-24 MED ORDER — METRONIDAZOLE 500 MG PO TABS: 1000.0000 mg | ORAL_TABLET | ORAL | Status: DC

## 2020-10-24 MED ORDER — DEXAMETHASONE SODIUM PHOSPHATE 10 MG/ML IJ SOLN
INTRAMUSCULAR | Status: AC
  Filled 2020-10-24: qty 1

## 2020-10-24 MED ORDER — CELECOXIB 200 MG PO CAPS
200.0000 mg | ORAL_CAPSULE | ORAL | Status: AC
  Administered 2020-10-24: 12:00:00 200 mg via ORAL
  Filled 2020-10-24: qty 1

## 2020-10-24 MED ORDER — EPHEDRINE SULFATE-NACL 50-0.9 MG/10ML-% IV SOSY
PREFILLED_SYRINGE | INTRAVENOUS | Status: DC | PRN
  Administered 2020-10-24 (×2): 5 mg via INTRAVENOUS

## 2020-10-24 MED ORDER — DEXAMETHASONE SODIUM PHOSPHATE 10 MG/ML IJ SOLN
INTRAMUSCULAR | Status: DC | PRN
  Administered 2020-10-24: 8 mg via INTRAVENOUS

## 2020-10-24 MED FILL — traMADol HCL 50 MG TABS: 50 | 3 days supply | Qty: 20 | Fill #0

## 2020-10-24 SURGICAL SUPPLY — 102 items
APPLIER CLIP 5 13 M/L LIGAMAX5 (MISCELLANEOUS)
APPLIER CLIP ROT 10 11.4 M/L (STAPLE)
BLADE EXTENDED COATED 6.5IN (ELECTRODE) IMPLANT
CANNULA REDUC XI 12-8 STAPL (CANNULA) ×2
CANNULA REDUCER 12-8 DVNC XI (CANNULA) ×1 IMPLANT
CELLS DAT CNTRL 66122 CELL SVR (MISCELLANEOUS) ×1 IMPLANT
CHLORAPREP W/TINT 26 (MISCELLANEOUS) ×2 IMPLANT
CLIP APPLIE 5 13 M/L LIGAMAX5 (MISCELLANEOUS) IMPLANT
CLIP APPLIE ROT 10 11.4 M/L (STAPLE) IMPLANT
COVER SURGICAL LIGHT HANDLE (MISCELLANEOUS) ×4 IMPLANT
COVER TIP SHEARS 8 DVNC (MISCELLANEOUS) ×1 IMPLANT
COVER TIP SHEARS 8MM DA VINCI (MISCELLANEOUS) ×2
COVER WAND RF STERILE (DRAPES) IMPLANT
DECANTER SPIKE VIAL GLASS SM (MISCELLANEOUS) ×2 IMPLANT
DEVICE TROCAR PUNCTURE CLOSURE (ENDOMECHANICALS) IMPLANT
DRAIN CHANNEL 19F RND (DRAIN) IMPLANT
DRAPE ARM DVNC X/XI (DISPOSABLE) ×4 IMPLANT
DRAPE COLUMN DVNC XI (DISPOSABLE) ×1 IMPLANT
DRAPE DA VINCI XI ARM (DISPOSABLE) ×8
DRAPE DA VINCI XI COLUMN (DISPOSABLE) ×2
DRAPE SURG IRRIG POUCH 19X23 (DRAPES) ×2 IMPLANT
DRSG OPSITE POSTOP 3X4 (GAUZE/BANDAGES/DRESSINGS) ×2 IMPLANT
DRSG OPSITE POSTOP 4X10 (GAUZE/BANDAGES/DRESSINGS) IMPLANT
DRSG OPSITE POSTOP 4X6 (GAUZE/BANDAGES/DRESSINGS) IMPLANT
DRSG OPSITE POSTOP 4X8 (GAUZE/BANDAGES/DRESSINGS) IMPLANT
DRSG TEGADERM 2-3/8X2-3/4 SM (GAUZE/BANDAGES/DRESSINGS) ×10 IMPLANT
DRSG TEGADERM 4X4.75 (GAUZE/BANDAGES/DRESSINGS) IMPLANT
ELECT PENCIL ROCKER SW 15FT (MISCELLANEOUS) ×2 IMPLANT
ELECT REM PT RETURN 15FT ADLT (MISCELLANEOUS) ×2 IMPLANT
ENDOLOOP SUT PDS II  0 18 (SUTURE)
ENDOLOOP SUT PDS II 0 18 (SUTURE) IMPLANT
EVACUATOR SILICONE 100CC (DRAIN) IMPLANT
GAUZE SPONGE 2X2 8PLY STRL LF (GAUZE/BANDAGES/DRESSINGS) ×1 IMPLANT
GLOVE ECLIPSE 8.0 STRL XLNG CF (GLOVE) ×6 IMPLANT
GLOVE INDICATOR 8.0 STRL GRN (GLOVE) ×6 IMPLANT
GOWN STRL REUS W/TWL XL LVL3 (GOWN DISPOSABLE) ×6 IMPLANT
GRASPER SUT TROCAR 14GX15 (MISCELLANEOUS) IMPLANT
HOLDER FOLEY CATH W/STRAP (MISCELLANEOUS) ×2 IMPLANT
IRRIG SUCT STRYKERFLOW 2 WTIP (MISCELLANEOUS) ×2
IRRIGATION SUCT STRKRFLW 2 WTP (MISCELLANEOUS) ×1 IMPLANT
KIT PROCEDURE DA VINCI SI (MISCELLANEOUS)
KIT PROCEDURE DVNC SI (MISCELLANEOUS) IMPLANT
KIT SIGMOIDOSCOPE (SET/KITS/TRAYS/PACK) IMPLANT
KIT TURNOVER KIT A (KITS) ×2 IMPLANT
NEEDLE INSUFFLATION 14GA 120MM (NEEDLE) ×2 IMPLANT
PACK CARDIOVASCULAR III (CUSTOM PROCEDURE TRAY) ×2 IMPLANT
PACK COLON (CUSTOM PROCEDURE TRAY) ×2 IMPLANT
PAD POSITIONING PINK XL (MISCELLANEOUS) ×2 IMPLANT
PORT LAP GEL ALEXIS MED 5-9CM (MISCELLANEOUS) ×2 IMPLANT
PROTECTOR NERVE ULNAR (MISCELLANEOUS) ×4 IMPLANT
RELOAD STAPLER 3.5X45 BLU DVNC (STAPLE) ×2 IMPLANT
RELOAD STAPLER 4.3X45 GRN DVNC (STAPLE) IMPLANT
RELOAD STAPLER 4.3X60 GRN DVNC (STAPLE) IMPLANT
RTRCTR WOUND ALEXIS 18CM MED (MISCELLANEOUS) ×2
SCISSORS LAP 5X35 DISP (ENDOMECHANICALS) ×2 IMPLANT
SEAL CANN UNIV 5-8 DVNC XI (MISCELLANEOUS) ×3 IMPLANT
SEAL XI 5MM-8MM UNIVERSAL (MISCELLANEOUS) ×6
SEALER VESSEL DA VINCI XI (MISCELLANEOUS) ×2
SEALER VESSEL EXT DVNC XI (MISCELLANEOUS) ×1 IMPLANT
SOLUTION ELECTROLUBE (MISCELLANEOUS) ×2 IMPLANT
SPONGE GAUZE 2X2 STER 10/PKG (GAUZE/BANDAGES/DRESSINGS) ×1
STAPLER 45 DA VINCI SURE FORM (STAPLE)
STAPLER 45 SUREFORM DVNC (STAPLE) IMPLANT
STAPLER 60 DA VINCI SURE FORM (STAPLE) ×2
STAPLER 60 SUREFORM DVNC (STAPLE) ×1 IMPLANT
STAPLER CANNULA SEAL DVNC XI (STAPLE) ×1 IMPLANT
STAPLER CANNULA SEAL XI (STAPLE) ×2
STAPLER ECHELON POWER CIR 29 (STAPLE) IMPLANT
STAPLER ECHELON POWER CIR 31 (STAPLE) IMPLANT
STAPLER RELOAD 3.5X45 BLU DVNC (STAPLE) ×2
STAPLER RELOAD 3.5X45 BLUE (STAPLE) ×4
STAPLER RELOAD 4.3X45 GREEN (STAPLE)
STAPLER RELOAD 4.3X45 GRN DVNC (STAPLE)
STAPLER RELOAD 4.3X60 GREEN (STAPLE)
STAPLER RELOAD 4.3X60 GRN DVNC (STAPLE)
STOPCOCK 4 WAY LG BORE MALE ST (IV SETS) ×4 IMPLANT
SURGILUBE 2OZ TUBE FLIPTOP (MISCELLANEOUS) IMPLANT
SUT MNCRL AB 4-0 PS2 18 (SUTURE) ×2 IMPLANT
SUT PDS AB 1 CT1 27 (SUTURE) ×4 IMPLANT
SUT PROLENE 0 CT 2 (SUTURE) IMPLANT
SUT PROLENE 2 0 KS (SUTURE) IMPLANT
SUT PROLENE 2 0 SH DA (SUTURE) IMPLANT
SUT SILK 2 0 (SUTURE)
SUT SILK 2 0 SH CR/8 (SUTURE) IMPLANT
SUT SILK 2-0 18XBRD TIE 12 (SUTURE) IMPLANT
SUT SILK 3 0 (SUTURE)
SUT SILK 3 0 SH CR/8 (SUTURE) ×2 IMPLANT
SUT SILK 3-0 18XBRD TIE 12 (SUTURE) IMPLANT
SUT V-LOC BARB 180 2/0GR6 GS22 (SUTURE) ×4
SUT VIC AB 3-0 SH 18 (SUTURE) IMPLANT
SUT VIC AB 3-0 SH 27 (SUTURE)
SUT VIC AB 3-0 SH 27XBRD (SUTURE) IMPLANT
SUT VICRYL 0 UR6 27IN ABS (SUTURE) ×2 IMPLANT
SUTURE V-LC BRB 180 2/0GR6GS22 (SUTURE) ×2 IMPLANT
SYR 10ML ECCENTRIC (SYRINGE) ×2 IMPLANT
SYS LAPSCP GELPORT 120MM (MISCELLANEOUS)
SYSTEM LAPSCP GELPORT 120MM (MISCELLANEOUS) IMPLANT
TOWEL OR NON WOVEN STRL DISP B (DISPOSABLE) ×2 IMPLANT
TRAY FOLEY MTR SLVR 16FR STAT (SET/KITS/TRAYS/PACK) ×2 IMPLANT
TROCAR ADV FIXATION 5X100MM (TROCAR) ×2 IMPLANT
TUBING CONNECTING 10 (TUBING) IMPLANT
TUBING INSUFFLATION 10FT LAP (TUBING) ×2 IMPLANT

## 2020-10-24 NOTE — Discharge Instructions (Signed)
SURGERY: POST OP INSTRUCTIONS °(Surgery for small bowel obstruction, colon resection, etc) ° ° °###################################################################### ° °EAT °Gradually transition to a high fiber diet with a fiber supplement over the next few days after discharge ° °WALK °Walk an hour a day.  Control your pain to do that.   ° °CONTROL PAIN °Control pain so that you can walk, sleep, tolerate sneezing/coughing, go up/down stairs. ° °HAVE A BOWEL MOVEMENT DAILY °Keep your bowels regular to avoid problems.  OK to try a laxative to override constipation.  OK to use an antidairrheal to slow down diarrhea.  Call if not better after 2 tries ° °CALL IF YOU HAVE PROBLEMS/CONCERNS °Call if you are still struggling despite following these instructions. °Call if you have concerns not answered by these instructions ° °###################################################################### ° ° °DIET °Follow a light diet the first few days at home.  Start with a bland diet such as soups, liquids, starchy foods, low fat foods, etc.  If you feel full, bloated, or constipated, stay on a ful liquid or pureed/blenderized diet for a few days until you feel better and no longer constipated. °Be sure to drink plenty of fluids every day to avoid getting dehydrated (feeling dizzy, not urinating, etc.). °Gradually add a fiber supplement to your diet over the next week.  Gradually get back to a regular solid diet.  Avoid fast food or heavy meals the first week as you are more likely to get nauseated. °It is expected for your digestive tract to need a few months to get back to normal.  It is common for your bowel movements and stools to be irregular.  You will have occasional bloating and cramping that should eventually fade away.  Until you are eating solid food normally, off all pain medications, and back to regular activities; your bowels will not be normal. °Focus on eating a low-fat, high fiber diet the rest of your life  (See Getting to Good Bowel Health, below). ° °CARE of your INCISION or WOUND °It is good for closed incision and even open wounds to be washed every day.  Shower every day.  Short baths are fine.  Wash the incisions and wounds clean with soap & water.    °If you have a closed incision(s), wash the incision with soap & water every day.  You may leave closed incisions open to air if it is dry.   You may cover the incision with clean gauze & replace it after your daily shower for comfort. °If you have skin tapes (Steristrips) or skin glue (Dermabond) on your incision, leave them in place.  They will fall off on their own like a scab.  You may trim any edges that curl up with clean scissors.  If you have staples, set up an appointment for them to be removed in the office in 10 days after surgery.  °If you have a drain, wash around the skin exit site with soap & water and place a new dressing of gauze or band aid around the skin every day.  Keep the drain site clean & dry.    °If you have an open wound with packing, see wound care instructions.  In general, it is encouraged that you remove your dressing and packing, shower with soap & water, and replace your dressing once a day.  Pack the wound with clean gauze moistened with normal (0.9%) saline to keep the wound moist & uninfected.  Pressure on the dressing for 30 minutes will stop most wound   bleeding.  Eventually your body will heal & pull the open wound closed over the next few months.  °Raw open wounds will occasionally bleed or secrete yellow drainage until it heals closed.  Drain sites will drain a little until the drain is removed.  Even closed incisions can have mild bleeding or drainage the first few days until the skin edges scab over & seal.   °If you have an open wound with a wound vac, see wound vac care instructions. ° ° ° ° °ACTIVITIES as tolerated °Start light daily activities --- self-care, walking, climbing stairs-- beginning the day after surgery.   Gradually increase activities as tolerated.  Control your pain to be active.  Stop when you are tired.  Ideally, walk several times a day, eventually an hour a day.   °Most people are back to most day-to-day activities in a few weeks.  It takes 4-8 weeks to get back to unrestricted, intense activity. °If you can walk 30 minutes without difficulty, it is safe to try more intense activity such as jogging, treadmill, bicycling, low-impact aerobics, swimming, etc. °Save the most intensive and strenuous activity for last (Usually 4-8 weeks after surgery) such as sit-ups, heavy lifting, contact sports, etc.  Refrain from any intense heavy lifting or straining until you are off narcotics for pain control.  You will have off days, but things should improve week-by-week. °DO NOT PUSH THROUGH PAIN.  Let pain be your guide: If it hurts to do something, don't do it.  Pain is your body warning you to avoid that activity for another week until the pain goes down. °You may drive when you are no longer taking narcotic prescription pain medication, you can comfortably wear a seatbelt, and you can safely make sudden turns/stops to protect yourself without hesitating due to pain. °You may have sexual intercourse when it is comfortable. If it hurts to do something, stop. ° °MEDICATIONS °Take your usually prescribed home medications unless otherwise directed.   °Blood thinners:  °Usually you can restart any strong blood thinners after the second postoperative day.  It is OK to take aspirin right away.    ° If you are on strong blood thinners (warfarin/Coumadin, Plavix, Xerelto, Eliquis, Pradaxa, etc), discuss with your surgeon, medicine PCP, and/or cardiologist for instructions on when to restart the blood thinner & if blood monitoring is needed (PT/INR blood check, etc).   ° ° °PAIN CONTROL °Pain after surgery or related to activity is often due to strain/injury to muscle, tendon, nerves and/or incisions.  This pain is usually  short-term and will improve in a few months.  °To help speed the process of healing and to get back to regular activity more quickly, DO THE FOLLOWING THINGS TOGETHER: °1. Increase activity gradually.  DO NOT PUSH THROUGH PAIN °2. Use Ice and/or Heat °3. Try Gentle Massage and/or Stretching °4. Take over the counter pain medication °5. Take Narcotic prescription pain medication for more severe pain ° °Good pain control = faster recovery.  It is better to take more medicine to be more active than to stay in bed all day to avoid medications. °1.  Increase activity gradually °Avoid heavy lifting at first, then increase to lifting as tolerated over the next 6 weeks. °Do not “push through” the pain.  Listen to your body and avoid positions and maneuvers than reproduce the pain.  Wait a few days before trying something more intense °Walking an hour a day is encouraged to help your body recover faster   and more safely.  Start slowly and stop when getting sore.  If you can walk 30 minutes without stopping or pain, you can try more intense activity (running, jogging, aerobics, cycling, swimming, treadmill, sex, sports, weightlifting, etc.) °Remember: If it hurts to do it, then don’t do it! °2. Use Ice and/or Heat °You will have swelling and bruising around the incisions.  This will take several weeks to resolve. °Ice packs or heating pads (6-8 times a day, 30-60 minutes at a time) will help sooth soreness & bruising. °Some people prefer to use ice alone, heat alone, or alternate between ice & heat.  Experiment and see what works best for you.  Consider trying ice for the first few days to help decrease swelling and bruising; then, switch to heat to help relax sore spots and speed recovery. °Shower every day.  Short baths are fine.  It feels good!  Keep the incisions and wounds clean with soap & water.   °3. Try Gentle Massage and/or Stretching °Massage at the area of pain many times a day °Stop if you feel pain - do not  overdo it °4. Take over the counter pain medication °This helps the muscle and nerve tissues become less irritable and calm down faster °Choose ONE of the following over-the-counter anti-inflammatory medications: °Acetaminophen 500mg tabs (Tylenol) 1-2 pills with every meal and just before bedtime (avoid if you have liver problems or if you have acetaminophen in you narcotic prescription) °Naproxen 220mg tabs (ex. Aleve, Naprosyn) 1-2 pills twice a day (avoid if you have kidney, stomach, IBD, or bleeding problems) °Ibuprofen 200mg tabs (ex. Advil, Motrin) 3-4 pills with every meal and just before bedtime (avoid if you have kidney, stomach, IBD, or bleeding problems) °Take with food/snack several times a day as directed for at least 2 weeks to help keep pain / soreness down & more manageable. °5. Take Narcotic prescription pain medication for more severe pain °A prescription for strong pain control is often given to you upon discharge (for example: oxycodone/Percocet, hydrocodone/Norco/Vicodin, or tramadol/Ultram) °Take your pain medication as prescribed. °Be mindful that most narcotic prescriptions contain Tylenol (acetaminophen) as well - avoid taking too much Tylenol. °If you are having problems/concerns with the prescription medicine (does not control pain, nausea, vomiting, rash, itching, etc.), please call us [phone] to see if we need to switch you to a different pain medicine that will work better for you and/or control your side effects better. °If you need a refill on your pain medication, you must call the office before 4 pm and on weekdays only.  By federal law, prescriptions for narcotics cannot be called into a pharmacy.  They must be filled out on paper & picked up from our office by the patient or authorized caretaker.  Prescriptions cannot be filled after 4 pm nor on weekends.   ° °WHEN TO CALL US [phone] °Severe uncontrolled or worsening pain  °Fever over 101 F (38.5 C) °Concerns with  the incision: Worsening pain, redness, rash/hives, swelling, bleeding, or drainage °Reactions / problems with new medications (itching, rash, hives, nausea, etc.) °Nausea and/or vomiting °Difficulty urinating °Difficulty breathing °Worsening fatigue, dizziness, lightheadedness, blurred vision °Other concerns °If you are not getting better after two weeks or are noticing you are getting worse, contact our office [phone] for further advice.  We may need to adjust your medications, re-evaluate you in the office, send you to the emergency room, or see what other things we can do to help. °The   clinic staff is available to answer your questions during regular business hours (8:30am-5pm).  Please don’t hesitate to call and ask to speak to one of our nurses for clinical concerns.    °A surgeon from [facility] is always on call at the hospitals 24 hours/day °If you have a medical emergency, go to the nearest emergency room or call 911. ° °FOLLOW UP in our office °One the day of your discharge from the hospital (or the next business weekday), please call [facility] to set up or confirm an appointment to see your surgeon in the office for a follow-up appointment.  Usually it is 2-3 weeks after your surgery.   °If you have skin staples at your incision(s), let the office know so we can set up a time in the office for the nurse to remove them (usually around 10 days after surgery). °Make sure that you call for appointments the day of discharge (or the next business weekday) from the hospital to ensure a convenient appointment time. °IF YOU HAVE DISABILITY OR FAMILY LEAVE FORMS, BRING THEM TO THE OFFICE FOR PROCESSING.  DO NOT GIVE THEM TO YOUR DOCTOR. ° °[facility] °[address], [address], [place], [place]  [address] ? °[phone] - Main °[phone] - Toll Free,  [phone] - Fax °[email or web address] ° °GETTING TO GOOD BOWEL HEALTH. °It is  expected for your digestive tract to need a few months to get back to normal.  It is common for your bowel movements and stools to be irregular.  You will have occasional bloating and cramping that should eventually fade away.  Until you are eating solid food normally, off all pain medications, and back to regular activities; your bowels will not be normal.   °Avoiding constipation °The goal: ONE SOFT BOWEL MOVEMENT A DAY!    °Drink plenty of fluids.  Choose water first. °TAKE A FIBER SUPPLEMENT EVERY DAY THE REST OF YOUR LIFE °During your first week back home, gradually add back a fiber supplement every day °Experiment which form you can tolerate.   There are many forms such as powders, tablets, wafers, gummies, etc °Psyllium bran (Metamucil), methylcellulose (Citrucel), Miralax or Glycolax, Benefiber, Flax Seed.  °Adjust the dose week-by-week (1/2 dose/day to 6 doses a day) until you are moving your bowels 1-2 times a day.  Cut back the dose or try a different fiber product if it is giving you problems such as diarrhea or bloating. °Sometimes a laxative is needed to help jump-start bowels if constipated until the fiber supplement can help regulate your bowels.  If you are tolerating eating & you are farting, it is okay to try a gentle laxative such as double dose MiraLax, prune juice, or Milk of Magnesia.  Avoid using laxatives too often. °Stool softeners can sometimes help counteract the constipating effects of narcotic pain medicines.  It can also cause diarrhea, so avoid using for too long. °If you are still constipated despite taking fiber daily, eating solids, and a few doses of laxatives, call our office. °Controlling diarrhea °Try drinking liquids and eating bland foods for a few days to avoid stressing your intestines further. °Avoid dairy products (especially milk & ice cream) for a short time.  The intestines often can lose the ability to digest lactose when stressed. °Avoid foods that cause gassiness or  bloating.  Typical foods include beans and other legumes, cabbage, broccoli, and dairy foods.  Avoid greasy, spicy, fast foods.  Every person has   some sensitivity to other foods, so listen to your body and avoid those foods that trigger problems for you. °Probiotics (such as active yogurt, Align, etc) may help repopulate the intestines and colon with normal bacteria and calm down a sensitive digestive tract °Adding a fiber supplement gradually can help thicken stools by absorbing excess fluid and retrain the intestines to act more normally.  Slowly increase the dose over a few weeks.  Too much fiber too soon can backfire and cause cramping & bloating. °It is okay to try and slow down diarrhea with a few doses of antidiarrheal medicines.   °Bismuth subsalicylate (ex. Kayopectate, Pepto Bismol) for a few doses can help control diarrhea.  Avoid if pregnant.   °Loperamide (Imodium) can slow down diarrhea.  Start with one tablet (2mg) first.  Avoid if you are having fevers or severe pain.  °ILEOSTOMY PATIENTS WILL HAVE CHRONIC DIARRHEA since their colon is not in use.    °Drink plenty of liquids.  You will need to drink even more glasses of water/liquid a day to avoid getting dehydrated. °Record output from your ileostomy.  Expect to empty the bag every 3-4 hours at first.  Most people with a permanent ileostomy empty their bag 4-6 times at the least.   °Use antidiarrheal medicine (especially Imodium) several times a day to avoid getting dehydrated.  Start with a dose at bedtime & breakfast.  Adjust up or down as needed.  Increase antidiarrheal medications as directed to avoid emptying the bag more than 8 times a day (every 3 hours). °Work with your wound ostomy nurse to learn care for your ostomy.  See ostomy care instructions. °TROUBLESHOOTING IRREGULAR BOWELS °1) Start with a soft & bland diet. No spicy, greasy, or fried foods.  °2) Avoid gluten/wheat or dairy products from diet to see if symptoms improve. °3) Miralax  17gm or flax seed mixed in 8oz. water or juice-daily. May use 2-4 times a day as needed. °4) Gas-X, Phazyme, etc. as needed for gas & bloating.  °5) Prilosec (omeprazole) over-the-counter as needed °6)  Consider probiotics (Align, Activa, etc) to help calm the bowels down ° °Call your doctor if you are getting worse or not getting better.  Sometimes further testing (cultures, endoscopy, X-ray studies, CT scans, bloodwork, etc.) may be needed to help diagnose and treat the cause of the diarrhea. °[facility] °[address], [address], [place], [place]  [address] °[phone] - Main.    °[phone]  - Toll Free.   [phone] - Fax °[email or web address] ° ° °

## 2020-10-24 NOTE — Interval H&P Note (Signed)
History and Physical Interval Note:  10/24/2020 12:01 PM  Sean Hernandez  has presented today for surgery, with the diagnosis of COLON CANCER.  The various methods of treatment have been discussed with the patient and family. After consideration of risks, benefits and other options for treatment, the patient has consented to  Procedure(s): XI ROBOT ASSISTED COLECTOMY PROXIMAL (N/A) as a surgical intervention.  The patient's history has been reviewed, patient examined, no change in status, stable for surgery.  I have reviewed the patient's chart and labs.  Questions were answered to the patient's satisfaction.    I have re-reviewed the the patient's records, history, medications, and allergies.  I have re-examined the patient.  I again discussed intraoperative plans and goals of post-operative recovery.  The patient agrees to proceed.  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   GERD (gastroesophageal reflux disease) 05/11/2020   Dysphagia 05/11/2020   Colon cancer screening 05/11/2020   Family history of colon cancer 05/11/2020    Past Medical History:  Diagnosis Date   Allergy    Arthritis    lower back   Asthma    Cancer ([place])    Colon   Complication of anesthesia    GERD (gastroesophageal reflux disease)    Osteoid osteoma    PONV (postoperative nausea and vomiting)     Past Surgical History:  Procedure Laterality Date   BIOPSY  08/27/2020   Procedure: BIOPSY;  Surgeon: Irving Copas., MD;  Location: [facility] ENDOSCOPY;  Service:  Gastroenterology;;   COLONOSCOPY  06/13/2020   COLONOSCOPY WITH PROPOFOL N/A 08/27/2020   Procedure: COLONOSCOPY WITH PROPOFOL;  Surgeon: Irving Copas., MD;  Location: [place];  Service: Gastroenterology;  Laterality: N/A;   DENTAL SURGERY     ESOPHAGEAL MANOMETRY N/A 08/31/2020   Procedure: ESOPHAGEAL MANOMETRY (EM);  Surgeon: Mauri Pole, MD;  Location: [facility] ENDOSCOPY;  Service: Endoscopy;  Laterality: N/A;   NASAL RECONSTRUCTION     OSTEOTOMY FIBULA     osteoasteoma   SUBMUCOSAL LIFTING INJECTION  08/27/2020   Procedure: SUBMUCOSAL LIFTING INJECTION;  Surgeon: Rush Landmark Telford Nab., MD;  Location: [place];  Service: Gastroenterology;;   SUBMUCOSAL TATTOO INJECTION  08/27/2020   Procedure: SUBMUCOSAL TATTOO INJECTION;  Surgeon: Irving Copas., MD;  Location: [facility] ENDOSCOPY;  Service: Gastroenterology;;   UPPER GI ENDOSCOPY  06/13/2020   WISDOM TOOTH EXTRACTION      Social History   Socioeconomic History   Marital status: Married    Spouse name: Not on file   Number of children: 3   Years of education: Not on file   Highest education level: Not on file  Occupational History   Not on file  Tobacco Use   Smoking status: Never Smoker   Smokeless tobacco: Never Used  Vaping Use   Vaping Use: Never used  Substance and Sexual Activity   Alcohol use: Yes    Alcohol/week: 5.0 standard drinks    Types: 5 Standard drinks or equivalent per week    Comment: beer/wine   Drug use: Never   Sexual activity: Yes  Other  Topics Concern   Not on file  Social History Narrative   Not on file   Social Determinants of Health   Financial Resource Strain: Not on file  Food Insecurity: Not on file  Transportation Needs: Not on file  Physical Activity: Not on file  Stress: Not on file  Social Connections: Not on file  Intimate Partner Violence: Not on file    Family History  Problem Relation Age of Onset   Dementia Mother 11       Alzheimer's    Colon polyps Mother    Hypertension Mother    Hypertension Father    COPD Father    Colon polyps Father    Autism Daughter    Depression Daughter    Lung cancer Paternal Grandfather        Smoker   Dementia Maternal Uncle        Alzheimer's   Lung cancer Paternal Uncle        w/ metastatsis; Unsure of smoking status   Colon cancer Neg Hx    Esophageal cancer Neg Hx    Rectal cancer Neg Hx    Stomach cancer Neg Hx    Inflammatory bowel disease Neg Hx    Liver disease Neg Hx    Pancreatic cancer Neg Hx     Medications Prior to Admission  Medication Sig Dispense Refill Last Dose   ibuprofen (ADVIL) 200 MG tablet Take 400-600 mg by mouth every 8 (eight) hours as needed for moderate pain.    Past Month at Unknown time   metroNIDAZOLE (FLAGYL) 500 MG tablet Take 1,000 mg by mouth 3 (three) times daily.    10/23/2020 at Unknown time   neomycin (MYCIFRADIN) 500 MG tablet Take 1,000 mg by mouth 3 (three) times daily.   10/23/2020 at Unknown time   pantoprazole (PROTONIX) 40 MG tablet Take 1 tablet (40 mg total) by mouth daily. 90 tablet 1 Past Week at Unknown time    Current Facility-Administered Medications  Medication Dose Route Frequency Provider Last Rate Last Admin   acetaminophen (TYLENOL) tablet 1,000 mg  1,000 mg Oral On Call to OR Michael Boston, MD       alvimopan (ENTEREG) capsule 12 mg  12 mg Oral On Call to OR Michael Boston, MD       bupivacaine liposome (EXPAREL) 1.3 % injection 266 mg  20 mL Infiltration Once Michael Boston, MD       cefoTEtan (CEFOTAN) 2 g in sodium chloride 0.9 % 100 mL IVPB  2 g Intravenous On Call to OR Michael Boston, MD       celecoxib (CELEBREX) capsule 200 mg  200 mg Oral On Call to OR Michael Boston, MD       chlorhexidine (PERIDEX) 0.12 % solution 15 mL  15 mL Mouth/Throat Once Catalina Gravel, MD       Or   MEDLINE mouth rinse  15 mL Mouth Rinse Once Catalina Gravel, MD       enoxaparin (LOVENOX) injection 40 mg  40 mg Subcutaneous Once  Michael Boston, MD       Derrill Memo ON 10/25/2020] feeding supplement (ENSURE PRE-SURGERY) liquid 296 mL  296 mL Oral Once Michael Boston, MD       feeding supplement (ENSURE PRE-SURGERY) liquid 592 mL  592 mL Oral Once Michael Boston, MD       gabapentin (NEURONTIN) capsule 300 mg  300 mg Oral On Call to OR Michael Boston, MD  lactated ringers infusion   Intravenous Continuous Catalina Gravel, MD       scopolamine (TRANSDERM-SCOP) 1 MG/3DAYS 1.5 mg  1 patch Transdermal Q72H Pervis Hocking, DO         Allergies  Allergen Reactions   Iodinated Diagnostic Agents Other (See Comments)    Jittery, dizziness, palpitations    BP (!) 139/91   Pulse (!) 59   Temp 98.2 F (36.8 C) (Oral)   Resp 18   Ht 5\' 7"  (1.702 m)   Wt 77.6 kg   SpO2 100%   BMI 26.78 kg/m   Labs: No results found for this or any previous visit (from the past 48 hour(s)).  Imaging / Studies: No results found.   [name], M.D., F.A.C.S. Gastrointestinal and Minimally Invasive Surgery [facility] [address] [place], [place] [address] [phone] Main / Paging  10/24/2020 12:01 PM    [name]

## 2020-10-24 NOTE — Anesthesia Procedure Notes (Signed)

## 2020-10-24 NOTE — H&P (Signed)
Sean Hernandez DOB: [birth year] Married / Language: English / Race: White Male  Patient Care Team: Delorse Limber as PCP - General (Family Medicine) Michael Boston, MD as Consulting Physician (General Surgery) Mansouraty, Telford Nab., MD as Consulting Physician (Gastroenterology)  ` Patient sent for surgical consultation at the request of Gabe Mansouraty  Chief Complaint: Cancer within recurrent polyp of cecum ` ` The patient is a normally gentleman found to have polyps on 49 year old screening colonoscopy.. Underwent removal. Cecal mass felt not amenable to easy endoscopic resection. Internal referral made for endoscopic mucosal resection. This was attempted earlier this month. Not able be fully lifted & removed so aggressive biopsies done. Surgical consultation requested. 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 CEA drawn 1.1. Initial CT scan and pelvis underwhelming. CT of chest concern for a possible liver lesion. MRI of abdomen being scheduled to evaluate possible liver lesion. That is to happen later this week.  Patient comes in by himself. Usually moves his bowels every day or so. No bouts of constipation and diarrhea. His father had colon polyps noted on endoscopies but there is no history of cancer. He's never had any abdominal surgery. Did have a bad ankle fracture requiring right hip iliac crest donation but nothing in the abdomen. Does not smoke. No diabetes. No sleep apnea. No tachycardia, poor issues. On blood thinners. Several miles without difficulty. Has had some heartburn or reflux issues stable on proton pump inhibitor. Seasonal allergies.  No personal nor family history of GI/colon cancer, inflammatory bowel disease, irritable bowel syndrome, allergy such as Celiac Sprue, dietary/dairy problems, colitis, ulcers nor gastritis. No recent sick contacts/gastroenteritis. No travel outside the country. No  changes in diet. No dysphagia to solids or liquids. No significant heartburn or reflux. No melena, hematemesis, coffee ground emesis. No evidence of prior gastric/peptic ulceration.  (Review of systems as stated in this history (HPI) or in the review of systems. Otherwise all other 12 point ROS are negative) ` `  SURGICAL PATHOLOGY * THIS IS AN ADDENDUM REPORT * CASE: [id number] PATIENT: Sean Hernandez Surgical Pathology Report Addendum   Reason for Addendum #1: DNA Mismatch Repair IHC Results  Clinical History: cecal polyp, R/O malignancy (cm)     FINAL MICROSCOPIC DIAGNOSIS:  A. COLON, CECUM, POLYPECTOMY: - Adenocarcinoma.  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 MMR will be ordered. Dr. Vic Ripper has reviewed the case. Dr. Rush Landmark was paged on 08/28/2020.   Torie Priebe DESCRIPTION:  Received in formalin are tan-pink soft tissue fragments that are submitted in toto. Number: Multiple. Size: 1.2 x 1 x 0.2 cm in aggregate. Blocks: 1  SW 08/27/2020    Final Diagnosis performed by Vicente Males, MD. Electronically signed 08/28/2020 Technical component performed at [facility]. [facility], [place] [address], [place], [place] [address]. Professional component performed at [facility], [place] [address] [place], [place] [address]. Immunohistochemistry Technical component (if applicable) was performed at [facility]. [address], [place], [place], [place] [address]. IMMUNOHISTOCHEMISTRY DISCLAIMER (if applicable): Some of these immunohistochemical stains may have been developed and the performance characteristics determine by [facility]. Some may not have been cleared or approved by the U.S. Food and Drug Administration. The FDA has determined that such clearance or approval is not necessary. This test is used for clinical purposes. It  should not be regarded as investigational or for research. This laboratory is certified under the Clinical Laboratory Improvement Amendments of  1988 (CLIA-88) as qualified to perform high complexity clinical laboratory testing. The controls stained appropriately.     ADDENDUM:  Mismatch Repair Protein (IHC)  SUMMARY INTERPRETATION: NORMAL  There is preserved expression of the major MMR proteins. There is a very low probability that microsatellite instability (MSI) is present. However, certain clinically significant MMR protein mutations may result in preservation of nuclear expression. It is recommended that the preservation of protein expression be correlated with molecular based MSI testing.  IHC EXPRESSION RESULTS  TEST RESULT MLH1: Preserved nuclear expression MSH2: Preserved nuclear expression MSH6: Preserved nuclear expression PMS2: Preserved nuclear expression  References: 1. Guidelines on Genetic Evaluation and Management of Lynch Syndrome: A Consensus Statement by the [place] Multi-Society Task Force on Colorectal Cancer Gae Dry. Sherlie Ban , MD, and other . Am Nicki [place] 2014; [phone]; doi: 10.1038/ajg.2014.186; published online 31 May 2013 2. Outcomes of screening endometrial cancer patients for Lynch syndrome by patient-administered checklist. Olena Heckle MS, and others. Gynecol Oncol 2013;131(3):619-623. 3. Muir-Torre syndrome (MTS): An update and approach to diagnosis and management. Shelly Flatten, MD and others. J Am Acad Dermatol [phone]         Addendum #1 performed by Jaquita Folds, MD. Electronically signed 08/30/2020 Technical component performed at [facility]. [facility], [place] [address] [place], [place] [address]. Professional component performed at [facility], [place] [address] [place], [place] [address]. Immunohistochemistry Technical  component (if applicable) was performed at [facility]. [address] [place], [place], [place] [address]. IMMUNOHISTOCHEMISTRY DISCLAIMER (if applicable): Some of these immunohistochemical stains may have been developed and the performance characteristics determine by [facility]. Some may not have been cleared or approved by the U.S. Food and Drug Administration. The FDA has determined that such clearance or approval is not necessary. This test is used for clinical purposes. It should not be regarded as investigational or for research. This laboratory is certified under the [place] (CLIA-88) as qualified to perform high complexity clinical laboratory testing. The controls stained appropriately.  ###########################################`  This patient encounter took 45 minutes today to perform the following: obtain history, perform exam, review outside records, interpret tests & imaging, counsel the patient on their diagnosis; and, document this encounter, including findings & plan in the electronic health record (EHR).   Past Surgical History Darden Palmer, [place]; 09/04/2020 11:57 AM) Colon Polyp Removal - Colonoscopy Foot Surgery Right. Hip Surgery Right. Oral Surgery  Diagnostic Studies History Darden Palmer, [place]; 09/04/2020 11:57 AM) Colonoscopy within last year  Allergies Darden Palmer, RMA; 09/04/2020 11:59 AM) Iodine *ANTISEPTICS & DISINFECTANTS* Contrast Media Ready-Box *MEDICAL DEVICES AND SUPPLIES* Allergies Reconciled  Medication History Darden Palmer, RMA; 09/04/2020 12:00 PM) Protonix (Oral) Specific strength unknown - Active. ZyrTEC (Oral) Specific strength unknown - Active. Medications Reconciled  Social History Darden Palmer, [place]; 09/04/2020 11:58 AM) Alcohol use Moderate alcohol use. Caffeine use Carbonated beverages, Coffee, Tea. Illicit drug use  Remotely quit drug use. Tobacco use Never smoker.  Family History Darden Palmer, [place]; 09/04/2020 11:57 AM) Colon Polyps Father, Mother. Depression Daughter. Hypertension Father, Mother. Respiratory Condition Father.  Other Problems Darden Palmer, [place]; 09/04/2020 11:57 AM) Back Pain Colon Cancer Gastroesophageal Reflux Disease     Review of Systems Darden Palmer RMA; 09/04/2020 11:58 AM) General Not Present- Appetite Loss, Chills, Fatigue, Fever, Night Sweats, Weight Gain and Weight Loss. Skin Not Present- Change in Wart/Mole, Dryness, Hives, Jaundice, New Lesions, Non-Healing Wounds, Rash and Ulcer. HEENT Present- Seasonal Allergies and  Wears glasses/contact lenses. Not Present- Earache, Hearing Loss, Hoarseness, Nose Bleed, Oral Ulcers, Ringing in the Ears, Sinus Pain, Sore Throat, Visual Disturbances and Yellow Eyes. Respiratory Not Present- Bloody sputum, Chronic Cough, Difficulty Breathing, Snoring and Wheezing. Breast Not Present- Breast Mass, Breast Pain, Nipple Discharge and Skin Changes. Cardiovascular Not Present- Chest Pain, Difficulty Breathing Lying Down, Leg Cramps, Palpitations, Rapid Heart Rate, Shortness of Breath and Swelling of Extremities. Gastrointestinal Present- Difficulty Swallowing. Not Present- Abdominal Pain, Bloating, Bloody Stool, Change in Bowel Habits, Chronic diarrhea, Constipation, Excessive gas, Gets full quickly at meals, Hemorrhoids, Indigestion, Nausea, Rectal Pain and Vomiting. Male Genitourinary Not Present- Blood in Urine, Change in Urinary Stream, Frequency, Impotence, Nocturia, Painful Urination, Urgency and Urine Leakage. Musculoskeletal Present- Back Pain. Not Present- Joint Pain, Joint Stiffness, Muscle Pain, Muscle Weakness and Swelling of Extremities. Neurological Not Present- Decreased Memory, Fainting, Headaches, Numbness, Seizures, Tingling, Tremor, Trouble walking and Weakness. Psychiatric Not Present- Anxiety, Bipolar,  Change in Sleep Pattern, Depression, Fearful and Frequent crying. Endocrine Not Present- Cold Intolerance, Excessive Hunger, Hair Changes, Heat Intolerance, Hot flashes and New Diabetes. Hematology Not Present- Blood Thinners, Easy Bruising, Excessive bleeding, Gland problems, HIV and Persistent Infections.  Vitals Lattie Haw Caldwell RMA; 09/04/2020 12:00 PM) 09/04/2020 12:00 PM Weight: 168.38 lb Height: 67in Body Surface Area: 1.88 m Body Mass Index: 26.37 kg/m  Temp.: 97.31F  Pulse: 65 (Regular)  P.OX: 98% (Room air) BP: 124/84(Sitting, Left Arm, Standard)        Physical Exam [name] MD; 09/04/2020 12:26 PM)  General Mental Status-Alert. General Appearance-Not in acute distress, Not Sickly. Orientation-Oriented X3. Hydration-Well hydrated. Voice-Normal.  Integumentary Global Assessment Upon inspection and palpation of skin surfaces of the - Axillae: non-tender, no inflammation or ulceration, no drainage. and Distribution of scalp and body hair is normal. General Characteristics Temperature - normal warmth is noted.  Head and Neck Head-normocephalic, atraumatic with no lesions or palpable masses. Face Global Assessment - atraumatic, no absence of expression. Neck Global Assessment - no abnormal movements, no bruit auscultated on the right, no bruit auscultated on the left, no decreased range of motion, non-tender. Trachea-midline. Thyroid Gland Characteristics - non-tender.  Eye Eyeball - Left-Extraocular movements intact, No Nystagmus - Left. Eyeball - Right-Extraocular movements intact, No Nystagmus - Right. Cornea - Left-No Hazy - Left. Cornea - Right-No Hazy - Right. Sclera/Conjunctiva - Left-No scleral icterus, No Discharge - Left. Sclera/Conjunctiva - Right-No scleral icterus, No Discharge - Right. Pupil - Left-Direct reaction to light normal. Pupil - Right-Direct reaction to light  normal.  ENMT Ears Pinna - Left - no drainage observed, no generalized tenderness observed. Pinna - Right - no drainage observed, no generalized tenderness observed. Nose and Sinuses External Inspection of the Nose - no destructive lesion observed. Inspection of the nares - Left - quiet respiration. Inspection of the nares - Right - quiet respiration. Mouth and Throat Lips - Upper Lip - no fissures observed, no pallor noted. Lower Lip - no fissures observed, no pallor noted. Nasopharynx - no discharge present. Oral Cavity/Oropharynx - Tongue - no dryness observed. Oral Mucosa - no cyanosis observed. Hypopharynx - no evidence of airway distress observed.  Chest and Lung Exam Inspection Movements - Normal and Symmetrical. Accessory muscles - No use of accessory muscles in breathing. Palpation Palpation of the chest reveals - Non-tender. Auscultation Breath sounds - Normal and Clear.  Cardiovascular Auscultation Rhythm - Regular. Murmurs & Other Heart Sounds - Auscultation of the heart reveals - No Murmurs and No Systolic Clicks.  Abdomen Inspection Inspection of the abdomen reveals - No Visible peristalsis and No Abnormal pulsations. Umbilicus - No Bleeding, No Urine drainage. Palpation/Percussion Palpation and Percussion of the abdomen reveal - Soft, Non Tender, No Rebound tenderness, No Rigidity (guarding) and No Cutaneous hyperesthesia. Note: Abdomen soft. Nontender. Not distended. No umbilical or incisional hernias. No guarding.  Male Genitourinary Sexual Maturity Tanner 5 - Adult hair pattern and Adult penile size and shape.  Peripheral Vascular Upper Extremity Inspection - Left - No Cyanotic nailbeds - Left, Not Ischemic. Inspection - Right - No Cyanotic nailbeds - Right, Not Ischemic.  Neurologic Neurologic evaluation reveals -normal attention span and ability to concentrate, able to name objects and repeat phrases. Appropriate fund of knowledge , normal  sensation and normal coordination. Mental Status Affect - not angry, not paranoid. Cranial Nerves-Normal Bilaterally. Gait-Normal.  Neuropsychiatric Mental status exam performed with findings of-able to articulate well with normal speech/language, rate, volume and coherence, thought content normal with ability to perform basic computations and apply abstract reasoning and no evidence of hallucinations, delusions, obsessions or homicidal/suicidal ideation.  Musculoskeletal Global Assessment Spine, Ribs and Pelvis - no instability, subluxation or laxity. Right Upper Extremity - no instability, subluxation or laxity.  Lymphatic Head & Neck  General Head & Neck Lymphatics: Bilateral - Description - No Localized lymphadenopathy. Axillary  General Axillary Region: Bilateral - Description - No Localized lymphadenopathy. Femoral & Inguinal  Generalized Femoral & Inguinal Lymphatics: Left - Description - No Localized lymphadenopathy. Right - Description - No Localized lymphadenopathy.    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 Not able to be removed by endoscopic mucosal resection in October with biopsy showing adenocarcinoma.  Standard of care is segmental colonic resection. Good candidate for robotic resection with intracorporeal anastomosis with enhance recovery pathway.  Patient had many appropriate questions. I worked answer them. He understands. He agrees with planning surgery. We are getting into 3 months since diagnosis, so I do not want to wait too long. He would like to be aggressive as well.    Written instructions provided The anatomy & physiology of the digestive tract was discussed. The pathophysiology of the colon was discussed. Natural history risks without surgery was discussed. I feel the risks of no intervention will lead to serious problems that outweigh the operative risks;  therefore, I recommended a partial colectomy to remove the pathology. Minimally invasive (Robotic/Laparoscopic) & open techniques were discussed.  Risks such as bleeding, infection, abscess, leak, reoperation, possible ostomy, hernia, heart attack, death, and other risks were discussed. I noted a good likelihood this will help address the problem. Goals of post-operative recovery were discussed as well. Need for adequate nutrition, daily bowel regimen and healthy physical activity, to optimize recovery was noted as well. We will work to minimize complications. Educational materials were available as well. Questions were answered. The patient expresses understanding & wishes to proceed with surgery.    LIVER MASS (R16.0) Impression: Concern of 1 cm lesion on left hepatic lobe on CT of chest. Appears benign by MRI  IMPRESSION: 1. The lesion of concern in segment 2 of the liver measures 0.8 by 0.5 by 0.5 cm, and has high T2 signal characteristics and suspected low T1 signal characteristics. Very poorly seen due to motion artifact. Enhancement characteristics are not assessed today. There is a high likelihood of this representing a benign lesion such as hemangioma or a small cyst, but if the previously seen lesion  along the cecum is indeed colon cancer, then this small lesion in the liver should be surveilled by standard follow up CT and/or MRI to exclude a small/early metastatic deposit.   Electronically Signed   By: Van Clines M.D.   On: 09/07/2020 15:17 [name], MD, FACS, MASCRS Gastrointestinal and Minimally Invasive Surgery  [facility] [address], [place], [place] [address] [phone] Fax [phone] Main/Paging  CONTACT INFORMATION: Weekday (9AM-5PM) concerns: Call [facility] main office at [phone] Weeknight (5PM-9AM) or Weekend/Holiday concerns: Check [email or web address] for General Surgery [facility] coverage (Please, do  not use SecureChat as it is not reliable communication to operating surgeons for immediate patient care)

## 2020-10-24 NOTE — Anesthesia Preprocedure Evaluation (Addendum)
Anesthesia Evaluation  Patient identified by MRN, date of birth, ID band Patient awake    Reviewed: Allergy & Precautions, NPO status , Patient's Chart, lab work & pertinent test results  History of Anesthesia Complications (+) PONV and history of anesthetic complications  Airway Mallampati: I  TM Distance: >3 FB Neck ROM: Full    Dental  (+) Teeth Intact, Dental Advisory Given   Pulmonary asthma (well controlled) ,    Pulmonary exam normal breath sounds clear to auscultation       Cardiovascular negative cardio ROS Normal cardiovascular exam Rhythm:Regular Rate:Normal     Neuro/Psych negative neurological ROS  negative psych ROS   GI/Hepatic GERD  Medicated and Controlled,(+)     substance abuse  alcohol use, Colon cancer   Endo/Other  negative endocrine ROShct 43.1  Renal/GU negative Renal ROS  negative genitourinary   Musculoskeletal  (+) Arthritis , Osteoarthritis,    Abdominal   Peds  Hematology negative hematology ROS (+)   Anesthesia Other Findings   Reproductive/Obstetrics negative OB ROS                           Anesthesia Physical Anesthesia Plan  ASA: II  Anesthesia Plan: General   Post-op Pain Management:    Induction: Intravenous  PONV Risk Score and Plan: 4 or greater and Ondansetron, Dexamethasone, Midazolam, Scopolamine patch - Pre-op and Treatment may vary due to age or medical condition  Airway Management Planned: Oral ETT  Additional Equipment: None  Intra-op Plan:   Post-operative Plan: Extubation in OR  Informed Consent: I have reviewed the patients History and Physical, chart, labs and discussed the procedure including the risks, benefits and alternatives for the proposed anesthesia with the patient or authorized representative who has indicated his/her understanding and acceptance.     Dental advisory given  Plan Discussed with:  CRNA  Anesthesia Plan Comments:        Anesthesia Quick Evaluation

## 2020-10-24 NOTE — Op Note (Signed)
10/24/2020  2:54 PM  PATIENT:  Sean Hernandez  49 y.o. male  Patient Care Team: Delorse Limber as PCP - General (Family Medicine) Michael Boston, MD as Consulting Physician (General Surgery) Mansouraty, Telford Nab., MD as Consulting Physician (Gastroenterology)  PRE-OPERATIVE DIAGNOSIS:  CANCER OF CECUM  POST-OPERATIVE DIAGNOSIS:  CANCER OF CECUM  PROCEDURE:   ROBOTIC PROXIMAL RIGHT COLECTOMY BILATERAL TAP BLOCK  SURGEON:  [name], MD  ASSISTANT: Leighton Ruff, MD, FACS. An experienced assistant was required given the standard of surgical care given the complexity of the case.  This assistant was needed for exposure, dissection, suctioning, retraction, instrument exchange, etc.  ANESTHESIA:     General  Nerve block provided with liposomal bupivacaine (Experel) mixed with 0.25% bupivacaine as a Bilateral TAP block x 17mL each side at the level of the transverse abdominis & preperitoneal spaces along the flank at the anterior axillary line, from subcostal ridge to iliac crest under laparoscopic guidance   Local field block at port sites & extraction wound  EBL:  Total I/O In: 100 [IV Piggyback:100] Out: 100 [Urine:75; Blood:25]  Delay start of Pharmacological VTE agent (>24hrs) due to surgical blood loss or risk of bleeding:  no  DRAINS: none   SPECIMEN:  PROXIMAL COLON  DISPOSITION OF SPECIMEN:  PATHOLOGY  COUNTS:  YES  PLAN OF CARE: Admit to inpatient   PATIENT DISPOSITION:  PACU - hemodynamically stable.  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  Surgical consultation requested I recommended segmental resection:  The anatomy & physiology of the digestive tract was discussed.  The pathophysiology was discussed.  Natural history risks without surgery was discussed.   I worked to give an overview of the disease and the frequent need to have multispecialty involvement.  I feel the risks of no  intervention will lead to serious problems that outweigh the operative risks; therefore, I recommended a partial colectomy to remove the pathology.  Laparoscopic & open techniques were discussed.   Risks such as bleeding, infection, abscess, leak, reoperation, possible ostomy, hernia, heart attack, death, and other risks were discussed.  I noted a good likelihood this will help address the problem.   Goals of post-operative recovery were discussed as well.  We will work to minimize complications.  An educational handout on the pathology was given as well.  Questions were answered.    The patient expresses understanding & wishes to proceed with surgery.  OR FINDINGS: Soft but bulky golf ball size polypoid mass in cecum just proximal to ascending colon tattooing. No obvious metastatic disease on visceral parietal peritoneum or liver.  It is an  ileocolonic anastomosis that rests in the epigastric region.  CASE DATA:  Type of patient?: Elective [facility] Private Case  Status of Case? Elective Scheduled  Infection Present At Time Of Surgery (PATOS)?  NO  DESCRIPTION:   Informed consent was confirmed.  The patient underwent general anaesthesia without difficulty.  The patient was positioned with arms tucked & secured appropriately.  VTE prevention in place.  The patient's abdomen was clipped, prepped, & draped in a sterile fashion.  Surgical timeout confirmed our plan.  The patient was positioned in reverse Trendelenburg.  Abdominal entry was gained using Varess technique at the left subcostal ridge on the anterior abdominal wall.  No elevated EtCO2 noted.  Port placed.  Camera inspection revealed no injury.  Extra ports were carefully placed under direct laparoscopic visualization.  We docked the  Inituitive Vinci robot carefully and placed intstruments under visualization  I mobilized & reflected the greater omentum and small bowel in the upper abdomen.  We did easily locate the tattooing in the  proximal ascending colon correlated with endoscopic fine I was able to elevate the proximal colon to isolate the ileocolonic pedicle.  I scored the ileal mesentery just proximal to that.   I carried that further dissection in a medial to lateral fashion.  I was able to bluntly get into the retro-mesenteric plane on the right side.  I freed the proximal right sided colonic mesentery off the retroperitoneum including the duodenal sweep, pancreatic head, & Gerota's fascia of the right kidney. I was able to get underneath the hepatic flexure.  I was able to get underneath the proximal and mid transverse colon.  I isolated the proximal ileocecal pedicle.  I skeletonized it & transected the vessels.    I then proceeded to mobilize the terminal ileum & proximal "right" colon in a lateral to medial fashion.  I mobilized the distal ileal mesentery off its retroperitoneal and pelvic attachments.  I mobilized the ascending colon off It is side wall attachments to the paracolic gutter and retroperitoneum.  I also mobilized the greater omentum off the mid transverse colon and mobilized the mid to proximal transverse colon in a superior to inferior fashion.  This allowed me to mobilize the hepatic flexure and get a complete mobilization of the proximal "right" colon to the mid-transverse colon..  I could isolate the pathology. When he went ahead and proceeded with transection.  I transected the distal ileal mesentery and then transected at the distal ileum with a robotic stapler 16mm blue load.  I then transected transverse colon mesentery just proximal to a dominant middle colic arterial pedicle radially.  Transected at the proximal transverse colon with a robotic stapler.  We assured hemostasis.   I did a side-to-side stapled anastomosis of ileum to mid-transverse colon using a 74mm white load in an isoperistaltic fashion.  (Distal stump of ileum to mid transverse colon for the distal end of the anastomosis.  Proximal end  of colon stump to more proximal ileum for the proximal end of the anastomosis).  I sewed the common staple channel wound with an absorbable suture ( 2-0 V-lock) in a running [place] fashion from each corner and meeting in the center.  I did meticulous inspection prove an airtight closure.   I protected the anastomosis line with an anterior omentopexy of greater omentum using V lock suture.  We did reinspection of the abdomen.  Hemostasis was good.   Ureters, retroperitoneum, and bowel uninjured.  The anastomosis looked healthy.   Endoluminal gas was evacuated.  We placed the wound protector through the suprapubic 4mm port site after it was enlarged in a Pfannenstiel fashion.  Specimen removed without incident.   Ports & wound protector removed.  Hemostasis was good.  Sterile unused instruments were used from this point.  I closed the skin at the port sites using Monocryl stitch and sterile dressing.  I closed the extraction wound using a 0 Vicryl vertical peritoneal closure and a #1 PDS transverse anterior rectal fascial closure like a small Pfannenstiel closure. I closed the skin with Monocryl stitches.   Patient is being extubated go to recovery room. I discussed postop care with the patient in detail the office & in the holding area. Instructions are written. I discussed operative findings, updated the patient's status, discussed probable steps to recovery, and gave  postoperative recommendations to the patient's spouse, Emmert Roethler.  Recommendations were made.  Questions were answered.  She expressed understanding & appreciation.   [name], M.D., F.A.C.S. Gastrointestinal and Minimally Invasive Surgery [facility] [address], [place], [place] [address] [phone] Main / Paging

## 2020-10-24 NOTE — Transfer of Care (Signed)
Immediate Anesthesia Transfer of Care Note  Patient: Ernestene Kiel  Procedure(s) Performed: ROBOTIC PROXIMAL RIGHT COLECTOMY WITH BILATERAL TAP BLOCK (N/A Abdomen)  Patient Location: PACU  Anesthesia Type:General  Level of Consciousness: awake, alert  and oriented  Airway & Oxygen Therapy: Patient Spontanous Breathing and Patient connected to face mask oxygen  Post-op Assessment: Report given to RN and Post -op Vital signs reviewed and stable  Post vital signs: Reviewed and stable  Last Vitals:  Vitals Value Taken Time  BP 145/84 10/24/20 1503  Temp    Pulse 76 10/24/20 1507  Resp 13 10/24/20 1507  SpO2 98 % 10/24/20 1507  Vitals shown include unvalidated device data.  Last Pain:  Vitals:   10/24/20 1202  TempSrc:   PainSc: 0-No pain         Complications: No complications documented.

## 2020-10-24 NOTE — Anesthesia Postprocedure Evaluation (Signed)
Anesthesia Post Note  Patient: Sean Hernandez  Procedure(s) Performed: ROBOTIC PROXIMAL RIGHT COLECTOMY WITH BILATERAL TAP BLOCK (N/A Abdomen)     Patient location during evaluation: PACU Anesthesia Type: General Level of consciousness: awake and alert, oriented and patient cooperative Pain management: pain level controlled Vital Signs Assessment: post-procedure vital signs reviewed and stable Respiratory status: spontaneous breathing, nonlabored ventilation and respiratory function stable Cardiovascular status: blood pressure returned to baseline and stable Postop Assessment: no apparent nausea or vomiting Anesthetic complications: no   No complications documented.  Last Vitals:  Vitals:   10/24/20 1545 10/24/20 1600  BP: 129/78 126/74  Pulse: 75 75  Resp: 11 12  Temp:  36.8 C  SpO2: 100% 100%    Last Pain:  Vitals:   10/24/20 1600  TempSrc:   PainSc: [place]

## 2020-10-25 LAB — BASIC METABOLIC PANEL
Anion gap: 8 (ref 5–15)
BUN: 10 mg/dL (ref 6–20)
CO2: 27 mmol/L (ref 22–32)
Calcium: 8.6 mg/dL — ABNORMAL LOW (ref 8.9–10.3)
Chloride: 103 mmol/L (ref 98–111)
Creatinine, Ser: 0.92 mg/dL (ref 0.61–1.24)
GFR, Estimated: >60 mL/min (ref >60)
Glucose, Bld: 128 mg/dL — ABNORMAL HIGH (ref 70–99)
Potassium: 5 mmol/L (ref 3.5–5.1)
Sodium: 138 mmol/L (ref 135–145)

## 2020-10-25 LAB — CBC
HCT: 37.5 % — ABNORMAL LOW (ref 39.0–52.0)
Hemoglobin: 12.8 g/dL — ABNORMAL LOW (ref 13.0–17.0)
MCH: 31.3 pg (ref 26.0–34.0)
MCHC: 34.1 g/dL (ref 30.0–36.0)
MCV: 91.7 fL (ref 80.0–100.0)
Platelets: 269 10*3/uL (ref 150–400)
RBC: 4.09 MIL/uL — ABNORMAL LOW (ref 4.22–5.81)
RDW: 13.1 % (ref 11.5–15.5)
WBC: 12.4 10*3/uL — ABNORMAL HIGH (ref 4.0–10.5)
nRBC: 0 % (ref 0.0–0.2)

## 2020-10-25 LAB — MAGNESIUM: Magnesium: 1.6 mg/dL — ABNORMAL LOW (ref 1.7–2.4)

## 2020-10-25 NOTE — Progress Notes (Signed)
Sean Hernandez [id number] [birth year]  CARE TEAM:  PCP: Brunetta Jeans, PA-C  Outpatient Care Team: Patient Care Team: Delorse Limber as PCP - General (Family Medicine) Michael Boston, MD as Consulting Physician (General Surgery) Mansouraty, Telford Nab., MD as Consulting Physician (Gastroenterology)  Inpatient Treatment Team: Treatment Team: Attending Provider: Michael Boston, MD; Technician: [place] Mallick, NT; Registered Nurse: Sibyl Parr, RN; Registered Nurse: Aura Dials, RN   Problem List:   Principal Problem:   Cancer of cecum s/p robotic proximal right colectomy 10/24/2020 Active Problems:   Liver mass, segment 2 (probable cyst)   1 Day Post-Op  10/24/2020  POST-OPERATIVE DIAGNOSIS:  CANCER OF CECUM  PROCEDURE:   ROBOTIC PROXIMAL RIGHT COLECTOMY BILATERAL TAP BLOCK  SURGEON:  [name], MD  OR FINDINGS:  Soft but bulky golf ball size polypoid mass in cecum just proximal to ascending colon tattooing. No obvious metastatic disease on visceral parietal peritoneum or liver.  It is an  ileocolonic anastomosis that rests in the epigastric region.  CASE DATA:  Type of patient?: Elective [facility] Private Case  Status of Case? Elective Scheduled  Infection Present At Time Of Surgery (PATOS)?  NO  Assessment  Stable  [facility] Stay = 1 days)  Plan:  -ERAs protocol -d/c IVF -adv diet -f/u pathology -VTE prophylaxis- SCDs, etc -mobilize as tolerated to help recovery  Disposition:  Disposition:  The patient is from: Home  Anticipate discharge to:  Home  Anticipated Date of Discharge is:  October 26, 2020  Barriers to discharge:  Pending Clinical improvement (more likely than not  Patient currently is NOT MEDICALLY STABLE for discharge from the hospital from a surgery standpoint.   20 minutes spent in review, evaluation, examination, counseling, and coordination of care.  More than 50% of that time was spent in  counseling.  10/25/2020    Subjective: (Chief complaint)  Walked in hallways.  Some soreness but controlled.  Tolerating liquids.  Objective:  Vital signs:  Vitals:   10/24/20 2140 10/25/20 0206 10/25/20 0552 10/25/20 0647  BP: (!) 103/50 106/69 101/62   Pulse: (!) 55 (!) 56 (!) 56   Resp: 18 18 19    Temp: 97.7 F (36.5 C) (!) 97.5 F (36.4 C) 98 F (36.7 C)   TempSrc: Oral Oral Oral   SpO2: 100% 100% 98%   Weight:    77.5 kg  Height:        Last BM Date: 10/24/20  Intake/Output   Yesterday:  12/15 0701 - 12/16 0700 In: 2910 [P.O.:1060; I.V.:1750; IV Piggyback:100] Out: 2450 [Urine:2425; Blood:25] This shift:  No intake/output data recorded.  Bowel function:  Flatus: No  BM:  No  Drain: (No drain)   Physical Exam:  General: Pt awake/alert in no acute distress Eyes: PERRL, normal EOM.  Sclera clear.  No icterus Neuro: CN II-XII intact w/o focal sensory/motor deficits. Lymph: No head/neck/groin lymphadenopathy Psych:  No delerium/psychosis/paranoia.  Oriented x 4 HENT: Normocephalic, Mucus membranes moist.  No thrush Neck: Supple, No tracheal deviation.  No obvious thyromegaly Chest: No pain to chest wall compression.  Good respiratory excursion.  No audible wheezing CV:  Pulses intact.  Regular rhythm.  No major extremity edema MS: Normal AROM mjr joints.  No obvious deformity  Abdomen: Soft.  Nondistended.  Mildly tender at incisions only.  No evidence of peritonitis.  No incarcerated hernias.  Ext:  No deformity.  No mjr edema.  No cyanosis Skin: No petechiae / purpurea.  No major  sores.  Warm and dry    Results:   Cultures: Recent Results (from the past 720 hour(s))  SARS CORONAVIRUS 2 (TAT 6-24 HRS) Nasopharyngeal Nasopharyngeal Swab     Status: None   Collection Time: 10/20/20  2:05 PM   Specimen: Nasopharyngeal Swab  Result Value Ref Range Status   SARS Coronavirus 2 NEGATIVE NEGATIVE Final    Comment: (NOTE) SARS-CoV-2 target  nucleic acids are NOT DETECTED.  The SARS-CoV-2 RNA is generally detectable in upper and lower respiratory specimens during the acute phase of infection. Negative results do not preclude SARS-CoV-2 infection, do not rule out co-infections with other pathogens, and should not be used as the sole basis for treatment or other patient management decisions. Negative results must be combined with clinical observations, patient history, and epidemiological information. The expected result is Negative.  Fact Sheet for Patients: SugarRoll.be  Fact Sheet for Healthcare Providers: https://www.woods-mathews.com/  This test is not yet approved or cleared by the [place] FDA and  has been authorized for detection and/or diagnosis of SARS-CoV-2 by FDA under an Emergency Use Authorization (EUA). This EUA will remain  in effect (meaning this test can be used) for the duration of the COVID-19 declaration under Se ction 564(b)(1) of the Act, 21 U.S.C. section 360bbb-3(b)(1), unless the authorization is terminated or revoked sooner.  Performed at [facility], [place] [address] [place], [place] [address]     Labs: Results for orders placed or performed during the hospital encounter of 10/24/20 (from the past 48 hour(s))  Basic metabolic panel     Status: Abnormal   Collection Time: 10/25/20  4:57 AM  Result Value Ref Range   Sodium 138 135 - 145 mmol/L   Potassium 5.0 3.5 - 5.1 mmol/L   Chloride 103 98 - 111 mmol/L   CO2 27 22 - 32 mmol/L   Glucose, Bld 128 (H) 70 - 99 mg/dL    Comment: Glucose reference range applies only to samples taken after fasting for at least 8 hours.   BUN 10 6 - 20 mg/dL   Creatinine, Ser 0.92 0.61 - 1.24 mg/dL   Calcium 8.6 (L) 8.9 - 10.3 mg/dL   GFR, Estimated >60 >60 mL/min    Comment: (NOTE) Calculated using the CKD-EPI Creatinine Equation (2021)    Anion gap 8 5 - 15    Comment: Performed at [facility], [place] [address], [place], [place] [address]  CBC     Status: Abnormal   Collection Time: 10/25/20  4:57 AM  Result Value Ref Range   WBC 12.4 (H) 4.0 - 10.5 K/uL   RBC 4.09 (L) 4.22 - 5.81 MIL/uL   Hemoglobin 12.8 (L) 13.0 - 17.0 g/dL   HCT 37.5 (L) 39.0 - 52.0 %   MCV 91.7 80.0 - 100.0 fL   MCH 31.3 26.0 - 34.0 pg   MCHC 34.1 30.0 - 36.0 g/dL   RDW 13.1 11.5 - 15.5 %   Platelets 269 150 - 400 K/uL   nRBC 0.0 0.0 - 0.2 %    Comment: Performed at [facility], [place] [address], [place], [place] [address]  Magnesium     Status: Abnormal   Collection Time: 10/25/20  4:57 AM  Result Value Ref Range   Magnesium 1.6 (L) 1.7 - 2.4 mg/dL    Comment: Performed at [facility], [place] [address] [place], [place] [address]    Imaging / Studies: No results found.  Medications /  Allergies: per chart  Antibiotics: Anti-infectives (From admission, onward)   Start     Dose/Rate Route Frequency Ordered Stop   10/25/20 0100  cefoTEtan (CEFOTAN) 2 g in sodium chloride 0.9 % 100 mL IVPB        2 g 200 mL/hr over 30 Minutes Intravenous Every 12 hours 10/24/20 1628 10/25/20 0214   10/24/20 1400  neomycin (MYCIFRADIN) tablet 1,000 mg  Status:  Discontinued       "And" Linked Group Details   1,000 mg Oral 3 times per day 10/24/20 1144 10/24/20 1147   10/24/20 1400  metroNIDAZOLE (FLAGYL) tablet 1,000 mg  Status:  Discontinued       "And" Linked Group Details   1,000 mg Oral 3 times per day 10/24/20 1144 10/24/20 1147   10/24/20 1145  cefoTEtan (CEFOTAN) 2 g in sodium chloride 0.9 % 100 mL IVPB        2 g 200 mL/hr over 30 Minutes Intravenous On call to O.R. 10/24/20 1144 10/24/20 1319        Note: Portions of this report may have been transcribed using voice recognition software. Every effort was made to ensure accuracy; however, inadvertent computerized transcription errors may be present.   Any transcriptional errors that result from  this process are unintentional.    [name], MD, FACS, MASCRS Gastrointestinal and Minimally Invasive Surgery  [facility] [address] [place], [place] [address] [phone] Fax [phone] Main/Paging  CONTACT INFORMATION: Weekday (9AM-5PM) concerns: Call [facility] main office at [phone] Weeknight (5PM-9AM) or Weekend/Holiday concerns: Check [email or web address] for General Surgery [facility] coverage (Please, do not use SecureChat as it is not reliable communication to operating surgeons for immediate patient care)      10/25/2020  8:11 AM

## 2020-10-25 NOTE — Progress Notes (Signed)
Patient's spouse called to report that their daughter tested positive for COVID on 12/16.

## 2020-10-26 LAB — SURGICAL PATHOLOGY

## 2020-10-26 NOTE — Discharge Summary (Signed)
Physician Discharge Summary    Patient ID: Sean Hernandez MRN: [id number] DOB/AGE: Jun 02, 1971  49 y.o.  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  Postoperatively, the patient gradually mobilized and advanced to a solid diet.  Pain and other symptoms were treated aggressively.    By the time of discharge, the patient was walking well the hallways, eating food, having flatus.  Pain was well-controlled on an oral medications.  Based on meeting discharge criteria and continuing to recover, I felt it was safe for the patient to be discharged from the hospital to further recover with close followup. Postoperative recommendations were discussed in detail.  They are written as well.  Discharged Condition: good  Discharge Exam: Blood pressure 110/60, pulse 66, temperature 98 F (36.7 C), temperature source Oral, resp. rate 15, height 5\' 7"  (1.702 m), weight 78.4 kg, SpO2 98 %.  General: Pt awake/alert/oriented x4 in No acute distress Eyes: PERRL, normal EOM.  Sclera clear.  No icterus Neuro: CN II-XII intact w/o focal sensory/motor deficits. Lymph: No head/neck/groin lymphadenopathy Psych:  No delerium/psychosis/paranoia HENT: Normocephalic, Mucus membranes  moist.  No thrush Neck: Supple, No tracheal deviation Chest: No chest wall pain w good excursion CV:  Pulses intact.  Regular rhythm MS: Normal AROM mjr joints.  No obvious deformity Abdomen: Soft.  Nondistended.  Mildly tender at incisions only.  No evidence of peritonitis.  No incarcerated hernias. Ext:  SCDs BLE.  No mjr edema.  No cyanosis Skin: No petechiae / purpura   Disposition:    Follow-up Information    Michael Boston, MD. Schedule an appointment as soon as possible for a visit in 3 weeks.   Specialty: General Surgery Why: To follow up after your operation Contact information: [place] [address] [phone]               Discharge disposition: 01-Home or Self Care       Discharge Instructions    Call MD for:   Complete by: As directed    FEVER > 101.5 F  (temperatures < 101.5 F are not significant)   Call MD for:   Complete by: As directed    FEVER > 101.5 F  (temperatures < 101.5 F are not significant)   Call MD for:  extreme fatigue   Complete by: As directed    Call MD for:  extreme fatigue   Complete by: As directed    Call MD for:  persistant dizziness or light-headedness   Complete by: As directed    Call MD for:  persistant dizziness or light-headedness   Complete by: As directed    Call MD for:  persistant nausea and vomiting   Complete by: As directed    Call MD for:  persistant nausea and vomiting  Complete by: As directed    Call MD for:  redness, tenderness, or signs of infection (pain, swelling, redness, odor or green/yellow discharge around incision site)   Complete by: As directed    Call MD for:  redness, tenderness, or signs of infection (pain, swelling, redness, odor or green/yellow discharge around incision site)   Complete by: As directed    Call MD for:  severe uncontrolled pain   Complete by: As directed    Call MD for:  severe uncontrolled pain   Complete by: As directed    Diet - low  sodium heart healthy   Complete by: As directed    Start with a bland diet such as soups, liquids, starchy foods, low fat foods, etc. the first few days at home. Gradually advance to a solid, low-fat, high fiber diet by the end of the first week at home.   Add a fiber supplement to your diet (Metamucil, etc) If you feel full, bloated, or constipated, stay on a full liquid or pureed/blenderized diet for a few days until you feel better and are no longer constipated.   Discharge instructions   Complete by: As directed    See Discharge Instructions If you are not getting better after two weeks or are noticing you are getting worse, contact our office [phone] for further advice.  We may need to adjust your medications, re-evaluate you in the office, send you to the emergency room, or see what other things we can do to help. The clinic staff is available to answer your questions during regular business hours (8:30am-5pm).  Please don't hesitate to call and ask to speak to one of our nurses for clinical concerns.    A surgeon from [facility] is always on call at the hospitals 24 hours/day If you have a medical emergency, go to the nearest emergency room or call 911.   Discharge instructions   Complete by: As directed    See Discharge Instructions If you are not getting better after two weeks or are noticing you are getting worse, contact our office [phone] for further advice.  We may need to adjust your medications, re-evaluate you in the office, send you to the emergency room, or see what other things we can do to help. The clinic staff is available to answer your questions during regular business hours (8:30am-5pm).  Please don't hesitate to call and ask to speak to one of our nurses for clinical concerns.    A surgeon from [facility] is always on call at the hospitals 24 hours/day If you have a medical emergency, go to the nearest emergency room or call 911.    Discharge wound care:   Complete by: As directed    It is good for closed incisions and even open wounds to be washed every day.  Shower every day.  Short baths are fine.  Wash the incisions and wounds clean with soap & water.    You may leave closed incisions open to air if it is dry.   You may cover the incision with clean gauze & replace it after your daily shower for comfort.  TEGADERM:  You have clear gauze band-aid dressings over your closed incision(s).  Remove the dressings 3 days after surgery.   Discharge wound care:   Complete by: As directed    It is good for closed incisions and even open wounds to be washed every day.  Shower every day.  Short baths  are fine.  Wash the incisions and wounds clean with soap & water.    You may leave closed incisions open to air if it is dry.   You may cover the incision with clean gauze & replace it after your daily shower for comfort.  TEGADERM:  You have clear gauze band-aid dressings over your closed incision(s).  Remove the dressings 3 days after surgery = SAT 12/18   Driving Restrictions   Complete by: As directed    You may drive when: - you are no longer taking narcotic prescription pain medication - you can comfortably wear a seatbelt - you can safely make sudden turns/stops without pain.   Driving Restrictions   Complete by: As directed    You may drive when: - you are no longer taking narcotic prescription pain medication - you can comfortably wear a seatbelt - you can safely make sudden turns/stops without pain.   Increase activity slowly   Complete by: As directed    Start light daily activities --- self-care, walking, climbing stairs- beginning the day after surgery.  Gradually increase activities as tolerated.  Control your pain to be active.  Stop when you are tired.  Ideally, walk several times a day, eventually an hour a day.   Most people are back to most day-to-day activities in a few weeks.  It takes 4-6 weeks to get back  to unrestricted, intense activity. If you can walk 30 minutes without difficulty, it is safe to try more intense activity such as jogging, treadmill, bicycling, low-impact aerobics, swimming, etc. Save the most intensive and strenuous activity for last (Usually 4-8 weeks after surgery) such as sit-ups, heavy lifting, contact sports, etc.  Refrain from any intense heavy lifting or straining until you are off narcotics for pain control.  You will have off days, but things should improve week-by-week. DO NOT PUSH THROUGH PAIN.  Let pain be your guide: If it hurts to do something, don't do it.   Increase activity slowly   Complete by: As directed    Start light daily activities --- self-care, walking, climbing stairs- beginning the day after surgery.  Gradually increase activities as tolerated.  Control your pain to be active.  Stop when you are tired.  Ideally, walk several times a day, eventually an hour a day.   Most people are back to most day-to-day activities in a few weeks.  It takes 4-6 weeks to get back to unrestricted, intense activity. If you can walk 30 minutes without difficulty, it is safe to try more intense activity such as jogging, treadmill, bicycling, low-impact aerobics, swimming, etc. Save the most intensive and strenuous activity for last (Usually 4-8 weeks after surgery) such as sit-ups, heavy lifting, contact sports, etc.  Refrain from any intense heavy lifting or straining until you are off narcotics for pain control.  You will have off days, but things should improve week-by-week. DO NOT PUSH THROUGH PAIN.  Let pain be your guide: If it hurts to do something, don't do it.   Lifting restrictions   Complete by: As directed    If you can walk 30 minutes without difficulty, it is safe to try more intense activity such as jogging, treadmill, bicycling, low-impact aerobics, swimming, etc. Save the most intensive and strenuous activity for last (Usually 4-8 weeks after surgery) such as  sit-ups, heavy lifting, contact sports, etc.   Refrain from any intense heavy lifting or straining until you are off narcotics for pain control.  You will  have off days, but things should improve week-by-week. DO NOT PUSH THROUGH PAIN.  Let pain be your guide: If it hurts to do something, don't do it.  Pain is your body warning you to avoid that activity for another week until the pain goes down.   Lifting restrictions   Complete by: As directed    If you can walk 30 minutes without difficulty, it is safe to try more intense activity such as jogging, treadmill, bicycling, low-impact aerobics, swimming, etc. Save the most intensive and strenuous activity for last (Usually 4-8 weeks after surgery) such as sit-ups, heavy lifting, contact sports, etc.   Refrain from any intense heavy lifting or straining until you are off narcotics for pain control.  You will have off days, but things should improve week-by-week. DO NOT PUSH THROUGH PAIN.  Let pain be your guide: If it hurts to do something, don't do it.  Pain is your body warning you to avoid that activity for another week until the pain goes down.   May shower / Bathe   Complete by: As directed    May shower / Bathe   Complete by: As directed    May walk up steps   Complete by: As directed    May walk up steps   Complete by: As directed    Remove dressing in 72 hours   Complete by: As directed    Make sure all dressings are removed by the third day after surgery.  Leave incisions open to air.  OK to cover incisions with gauze or bandages as desired   Remove dressing in 72 hours   Complete by: As directed    Make sure all dressings are removed by the third day after surgery = SAT 12/18.  Leave incisions open to air.  OK to cover incisions with gauze or bandages as desired   Sexual Activity Restrictions   Complete by: As directed    You may have sexual intercourse when it is comfortable. If it hurts to do something, stop.   Sexual Activity  Restrictions   Complete by: As directed    You may have sexual intercourse when it is comfortable. If it hurts to do something, stop.      Allergies as of 10/26/2020      Reactions   Iodinated Diagnostic Agents Other (See Comments)   Jittery, dizziness, palpitations      Medication List    STOP taking these medications   metroNIDAZOLE 500 MG tablet Commonly known as: FLAGYL   neomycin 500 MG tablet Commonly known as: MYCIFRADIN     TAKE these medications   ibuprofen 200 MG tablet Commonly known as: ADVIL Take 400-600 mg by mouth every 8 (eight) hours as needed for moderate pain.   pantoprazole 40 MG tablet Commonly known as: PROTONIX Take 1 tablet (40 mg total) by mouth daily.   traMADol 50 MG tablet Commonly known as: ULTRAM Take 1-2 tablets (50-100 mg total) by mouth every 6 (six) hours as needed for moderate pain or severe pain.            Discharge Care Instructions  (From admission, onward)         Start     Ordered   10/26/20 0000  Discharge wound care:       Comments: It is good for closed incisions and even open wounds to be washed every day.  Shower every day.  Short baths are fine.  Wash the incisions and wounds  clean with soap & water.    You may leave closed incisions open to air if it is dry.   You may cover the incision with clean gauze & replace it after your daily shower for comfort.  TEGADERM:  You have clear gauze band-aid dressings over your closed incision(s).  Remove the dressings 3 days after surgery = SAT 12/18   10/26/20 0755   10/24/20 0000  Discharge wound care:       Comments: It is good for closed incisions and even open wounds to be washed every day.  Shower every day.  Short baths are fine.  Wash the incisions and wounds clean with soap & water.    You may leave closed incisions open to air if it is dry.   You may cover the incision with clean gauze & replace it after your daily shower for comfort.  TEGADERM:  You have clear gauze  band-aid dressings over your closed incision(s).  Remove the dressings 3 days after surgery.   10/24/20 1257          Significant Diagnostic Studies:  Results for orders placed or performed during the hospital encounter of 10/24/20 (from the past 72 hour(s))  Basic metabolic panel     Status: Abnormal   Collection Time: 10/25/20  4:57 AM  Result Value Ref Range   Sodium 138 135 - 145 mmol/L   Potassium 5.0 3.5 - 5.1 mmol/L   Chloride 103 98 - 111 mmol/L   CO2 27 22 - 32 mmol/L   Glucose, Bld 128 (H) 70 - 99 mg/dL    Comment: Glucose reference range applies only to samples taken after fasting for at least 8 hours.   BUN 10 6 - 20 mg/dL   Creatinine, Ser 0.92 0.61 - 1.24 mg/dL   Calcium 8.6 (L) 8.9 - 10.3 mg/dL   GFR, Estimated >60 >60 mL/min    Comment: (NOTE) Calculated using the CKD-EPI Creatinine Equation (2021)    Anion gap 8 5 - 15    Comment: Performed at [facility], [place] [address], [place], [place] [address]  CBC     Status: Abnormal   Collection Time: 10/25/20  4:57 AM  Result Value Ref Range   WBC 12.4 (H) 4.0 - 10.5 K/uL   RBC 4.09 (L) 4.22 - 5.81 MIL/uL   Hemoglobin 12.8 (L) 13.0 - 17.0 g/dL   HCT 37.5 (L) 39.0 - 52.0 %   MCV 91.7 80.0 - 100.0 fL   MCH 31.3 26.0 - 34.0 pg   MCHC 34.1 30.0 - 36.0 g/dL   RDW 13.1 11.5 - 15.5 %   Platelets 269 150 - 400 K/uL   nRBC 0.0 0.0 - 0.2 %    Comment: Performed at [facility], [place] [address], [place], [place] [address]  Magnesium     Status: Abnormal   Collection Time: 10/25/20  4:57 AM  Result Value Ref Range   Magnesium 1.6 (L) 1.7 - 2.4 mg/dL    Comment: Performed at [facility], [place] [address] [place], [place] [address]    No results found.  Past Medical History:  Diagnosis Date  . Allergy   . Arthritis    lower back  . Asthma   . Cancer [facility])    Colon  . Complication of anesthesia   . GERD (gastroesophageal reflux disease)   . Osteoid osteoma    . PONV (postoperative nausea and vomiting)     Past Surgical  History:  Procedure Laterality Date  . BIOPSY  08/27/2020   Procedure: BIOPSY;  Surgeon: Irving Copas., MD;  Location: [place];  Service: Gastroenterology;;  . COLONOSCOPY  06/13/2020  . COLONOSCOPY WITH PROPOFOL N/A 08/27/2020   Procedure: COLONOSCOPY WITH PROPOFOL;  Surgeon: Rush Landmark Telford Nab., MD;  Location: [place];  Service: Gastroenterology;  Laterality: N/A;  . DENTAL SURGERY    . ESOPHAGEAL MANOMETRY N/A 08/31/2020   Procedure: ESOPHAGEAL MANOMETRY (EM);  Surgeon: Mauri Pole, MD;  Location: [facility];  Service: Endoscopy;  Laterality: N/A;  . NASAL RECONSTRUCTION    . OSTEOTOMY FIBULA     osteoasteoma  . SUBMUCOSAL LIFTING INJECTION  08/27/2020   Procedure: SUBMUCOSAL LIFTING INJECTION;  Surgeon: Rush Landmark Telford Nab., MD;  Location: [place];  Service: Gastroenterology;;  . [place] INJECTION  08/27/2020   Procedure: SUBMUCOSAL TATTOO INJECTION;  Surgeon: Irving Copas., MD;  Location: [place];  Service: Gastroenterology;;  . UPPER GI ENDOSCOPY  06/13/2020  . WISDOM TOOTH EXTRACTION      Social History   Socioeconomic History  . Marital status: Married    Spouse name: Not on file  . Number of children: 3  . Years of education: Not on file  . Highest education level: Not on file  Occupational History  . Not on file  Tobacco Use  . Smoking status: Never Smoker  . Smokeless tobacco: Never Used  Vaping Use  . Vaping Use: Never used  Substance and Sexual Activity  . Alcohol use: Yes    Alcohol/week: 5.0 standard drinks    Types: 5 Standard drinks or equivalent per week    Comment: beer/wine  . Drug use: Never  . Sexual activity: Yes  Other Topics Concern  . Not on file  Social History Narrative  . Not on file   Social Determinants of Health   Financial Resource Strain: Not on file  Food Insecurity: Not on file  Transportation  Needs: Not on file  Physical Activity: Not on file  Stress: Not on file  Social Connections: Not on file  Intimate Partner Violence: Not on file    Family History  Problem Relation Age of Onset  . Dementia Mother 39       Alzheimer's  . Colon polyps Mother   . Hypertension Mother   . Hypertension Father   . COPD Father   . Colon polyps Father   . Autism Daughter   . Depression Daughter   . Lung cancer Paternal Grandfather        Smoker  . Dementia Maternal Uncle        Alzheimer's  . Lung cancer Paternal Uncle        w/ metastatsis; Unsure of smoking status  . Colon cancer Neg Hx   . Esophageal cancer Neg Hx   . Rectal cancer Neg Hx   . Stomach cancer Neg Hx   . Inflammatory bowel disease Neg Hx   . Liver disease Neg Hx   . Pancreatic cancer Neg Hx     Current Facility-Administered Medications  Medication Dose Route Frequency Provider Last Rate Last Admin  . 0.9 %  sodium chloride infusion   Intravenous Q8H PRN Michael Boston, MD      . 0.9 %  sodium chloride infusion  250 mL Intravenous PRN Michael Boston, MD      . acetaminophen (TYLENOL) tablet 1,000 mg  1,000 mg Oral Lajuana Ripple, MD   1,000 mg at 10/26/20 0534  .  alum & mag hydroxide-simeth (MAALOX/MYLANTA) 200-200-20 MG/5ML suspension 30 mL  30 mL Oral Q6H PRN Michael Boston, MD      . alvimopan (ENTEREG) capsule 12 mg  12 mg Oral BID Michael Boston, MD   12 mg at 10/25/20 1005  . diphenhydrAMINE (BENADRYL) 12.5 MG/5ML elixir 12.5 mg  12.5 mg Oral Q6H PRN Michael Boston, MD       Or  . diphenhydrAMINE (BENADRYL) injection 12.5 mg  12.5 mg Intravenous Q6H PRN Michael Boston, MD      . enoxaparin (LOVENOX) injection 40 mg  40 mg Subcutaneous Q24H Michael Boston, MD   40 mg at 10/25/20 0741  . feeding supplement (ENSURE SURGERY) liquid 237 mL  237 mL Oral BID BM Michael Boston, MD   237 mL at 10/25/20 1438  . gabapentin (NEURONTIN) capsule 200 mg  200 mg Oral TID Michael Boston, MD   200 mg at 10/25/20 2151  .  HYDROmorphone (DILAUDID) injection 0.5-2 mg  0.5-2 mg Intravenous Q4H PRN Michael Boston, MD      . lip balm (CARMEX) ointment 1 application  1 application Topical BID Michael Boston, MD   1 application at 09/47/09 2155  . magic mouthwash  15 mL Oral QID PRN Michael Boston, MD      . methocarbamol (ROBAXIN) 1,000 mg in dextrose 5 % 100 mL IVPB  1,000 mg Intravenous Q6H PRN Michael Boston, MD      . methocarbamol (ROBAXIN) tablet 1,000 mg  1,000 mg Oral Q6H PRN Michael Boston, MD      . metoprolol tartrate (LOPRESSOR) injection 5 mg  5 mg Intravenous Q6H PRN Michael Boston, MD      . ondansetron [facility]) tablet 4 mg  4 mg Oral Q6H PRN Michael Boston, MD       Or  . ondansetron (ZOFRAN) injection 4 mg  4 mg Intravenous Q6H PRN Michael Boston, MD      . pantoprazole (PROTONIX) EC tablet 40 mg  40 mg Oral Daily Michael Boston, MD   40 mg at 10/25/20 1006  . prochlorperazine (COMPAZINE) tablet 10 mg  10 mg Oral Q6H PRN Michael Boston, MD       Or  . prochlorperazine (COMPAZINE) injection 5-10 mg  5-10 mg Intravenous Q6H PRN Michael Boston, MD      . psyllium (HYDROCIL/METAMUCIL) 1 packet  1 packet Oral Daily Michael Boston, MD   1 packet at 10/25/20 1006  . sodium chloride flush (NS) 0.9 % injection 3 mL  3 mL Intravenous Gorden Harms, MD   3 mL at 10/25/20 2155  . sodium chloride flush (NS) 0.9 % injection 3 mL  3 mL Intravenous PRN Michael Boston, MD      . traMADol Veatrice Bourbon) tablet 50 mg  50 mg Oral Q6H PRN Michael Boston, MD   50 mg at 10/25/20 2150     Allergies  Allergen Reactions  . Iodinated Diagnostic Agents Other (See Comments)    Jittery, dizziness, palpitations    Signed: Morton Peters, MD, FACS, MASCRS Gastrointestinal and Minimally Invasive Surgery  [facility] [address] [place], [place] [address] [phone] Fax [phone] Main/Paging  CONTACT INFORMATION: Weekday (9AM-5PM) concerns: Call [facility] main office at  [phone] Weeknight (5PM-9AM) or Weekend/Holiday concerns: Check [email or web address] for General Surgery [facility] coverage (Please, do not use SecureChat as it is not reliable communication to operating surgeons for immediate patient care)      10/26/2020, 7:55 AM

## 2020-10-26 NOTE — Progress Notes (Signed)
Patient was given discharge instructions, and all questions were answered.  Patient was stable at discharge and was taken to the main exit by wheelchair.

## 2021-01-08 ENCOUNTER — Other Ambulatory Visit: Payer: Self-pay | Admitting: Physician Assistant

## 2021-01-08 DIAGNOSIS — K2100 Gastro-esophageal reflux disease with esophagitis, without bleeding: Secondary | ICD-10-CM

## 2021-01-08 DIAGNOSIS — K222 Esophageal obstruction: Secondary | ICD-10-CM

## 2021-01-08 DIAGNOSIS — R0789 Other chest pain: Secondary | ICD-10-CM

## 2021-02-27 ENCOUNTER — Other Ambulatory Visit: Payer: Self-pay | Admitting: Surgery

## 2021-02-27 DIAGNOSIS — R160 Hepatomegaly, not elsewhere classified: Secondary | ICD-10-CM

## 2021-03-05 ENCOUNTER — Other Ambulatory Visit: Payer: Self-pay | Admitting: Surgery

## 2021-03-05 DIAGNOSIS — R160 Hepatomegaly, not elsewhere classified: Secondary | ICD-10-CM

## 2021-03-19 ENCOUNTER — Other Ambulatory Visit: Payer: Self-pay

## 2021-03-19 ENCOUNTER — Ambulatory Visit
Admission: RE | Admit: 2021-03-19 | Discharge: 2021-03-19 | Disposition: A | Discharge status: Home / Self Care | Payer: BC Managed Care – PPO | Source: Ambulatory Visit | Attending: Surgery | Admitting: Surgery

## 2021-03-19 DIAGNOSIS — R160 Hepatomegaly, not elsewhere classified: Secondary | ICD-10-CM

## 2021-03-19 IMAGING — MR MR ABDOMEN W/O CM
7 of 8 series · 36 of 48 positions shown · non-contrast
Comparison: Abdominal MRI [DATE]. Abdominal CT [DATE] and
chest CT [DATE].

CLINICAL DATA: Follow-up liver lesion. History of colon cancer
status post reported partial colon resection. Patient reports
contrast allergy.

EXAM:
MRI ABDOMEN WITHOUT CONTRAST
TECHNIQUE: Multiplanar multisequence MR imaging was performed without the
administration of intravenous contrast. Patient refused contrast.

[Series 3: cor haste · coronal · 5.0mm · 0.68mm/px · 3 of 32 slices shown]
[im 1/32]
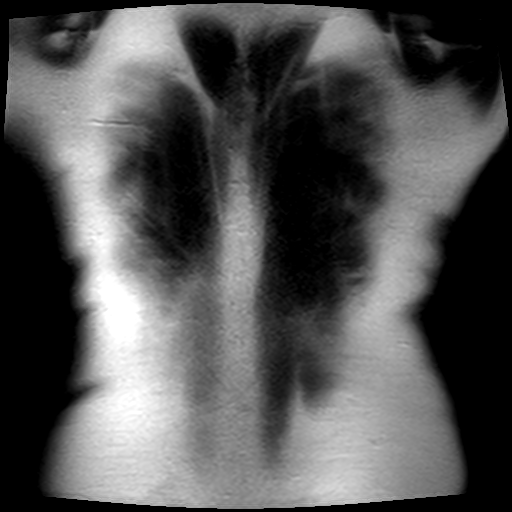
[im 16/32]
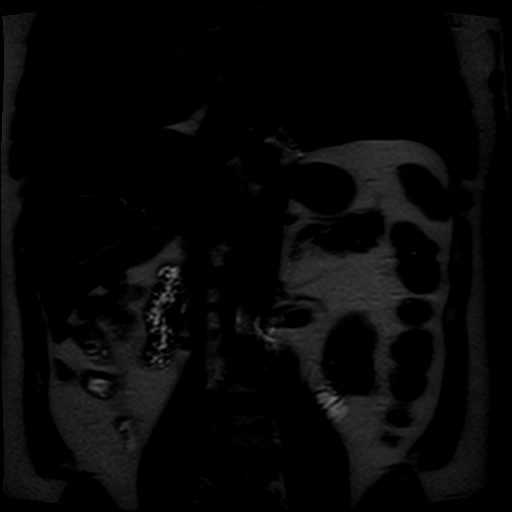
[im 32/32]
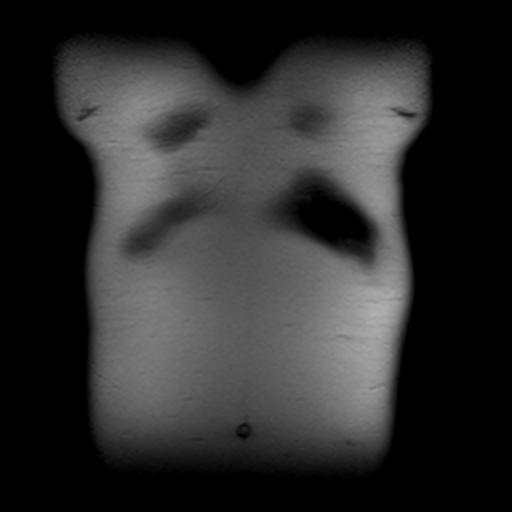

[Series 4: axial haste · axial · 6.0mm · 0.68mm/px · z∈[-107,+111]mm · 4 of 34 slices shown]
[im 1/34]
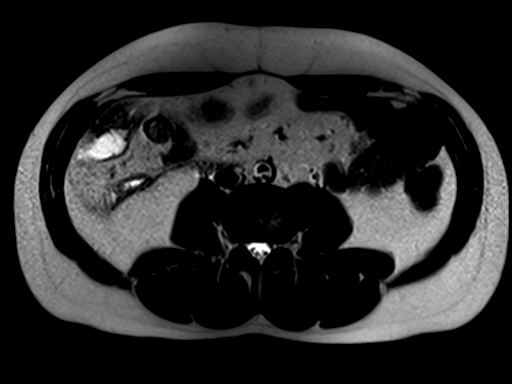
[im 12/34]
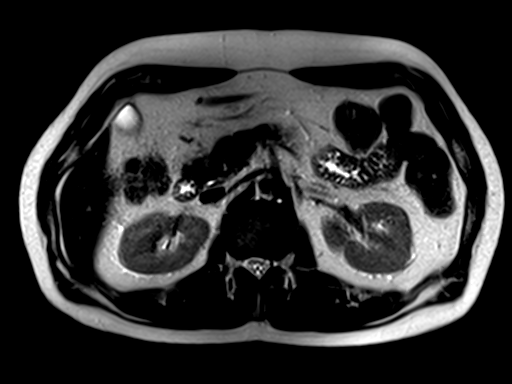
[im 23/34]
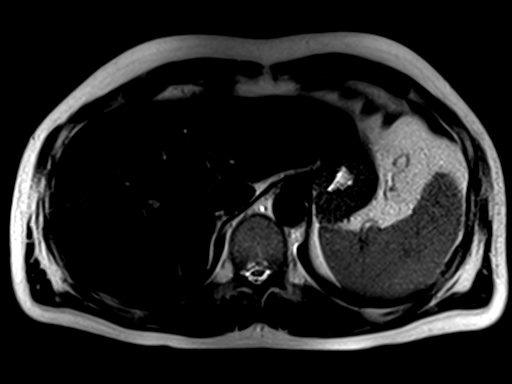
[im 34/34]
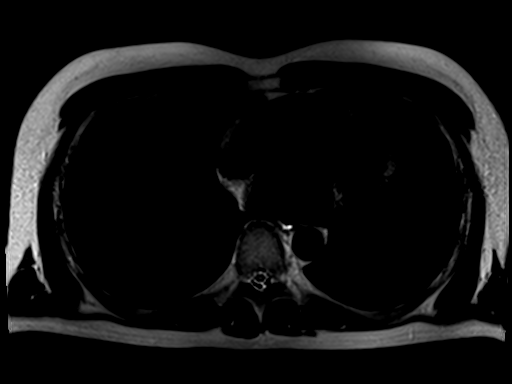

[Series 5: T1 · axial · 6.0mm · 0.68mm/px · z∈[-103,+108]mm · 7 of 66 slices shown]
[im 1/66]
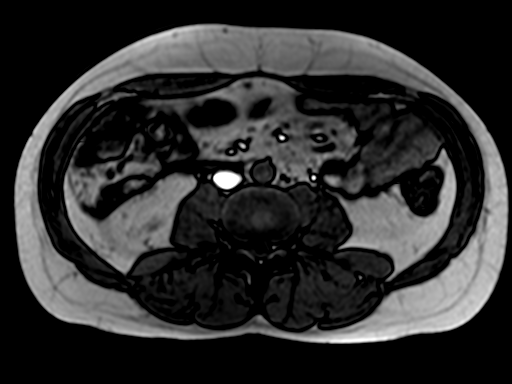
[im 11/66]
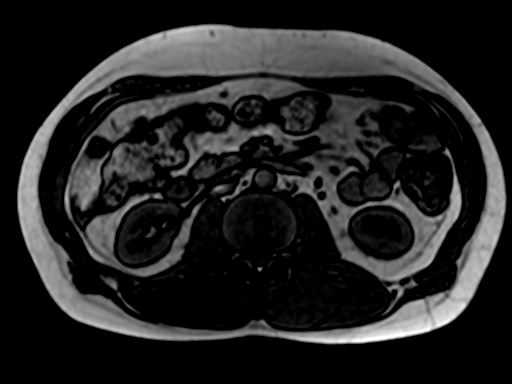
[im 22/66]
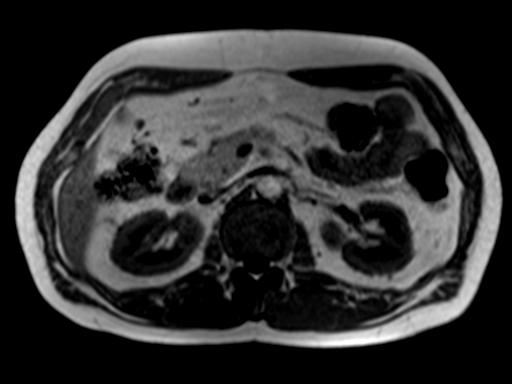
[im 33/66]
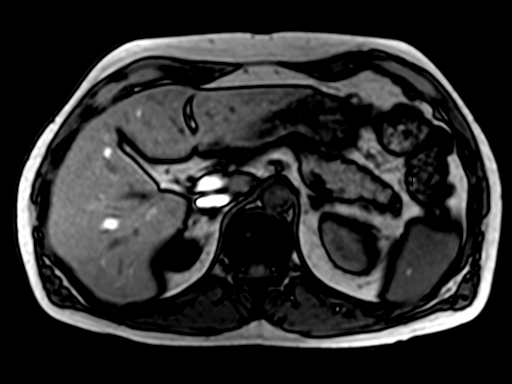
[im 44/66]
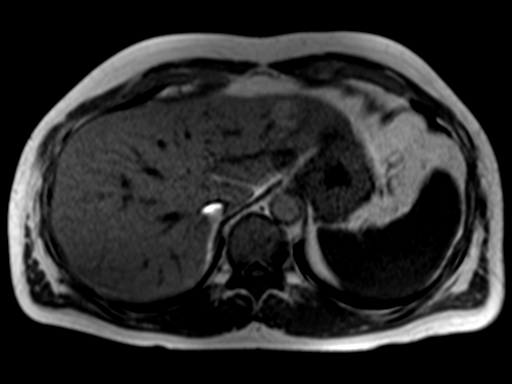
[im 55/66]
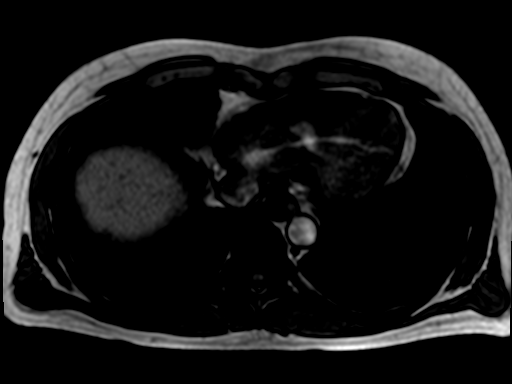
[im 66/66]
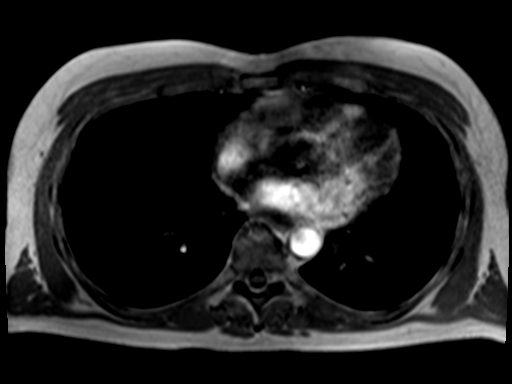

[Series 6: bSSFP · axial · 4.0mm · 0.68mm/px · z∈[-118,+122]mm · 6 of 61 slices shown]
[im 1/61]
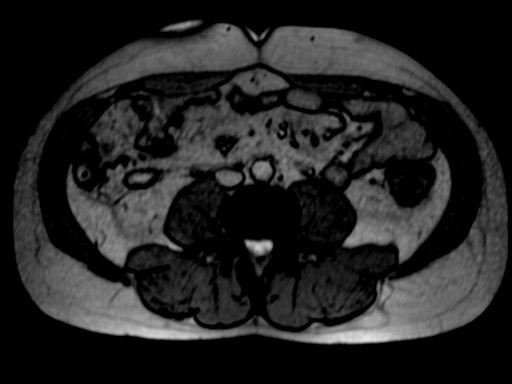
[im 13/61]
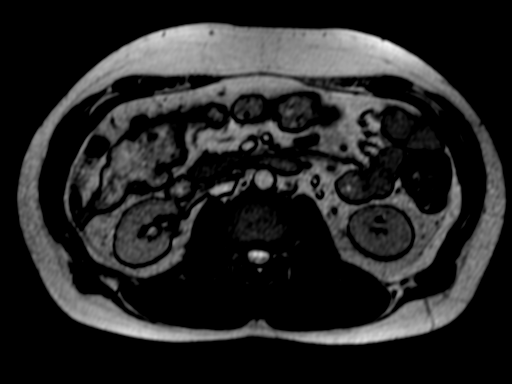
[im 25/61]
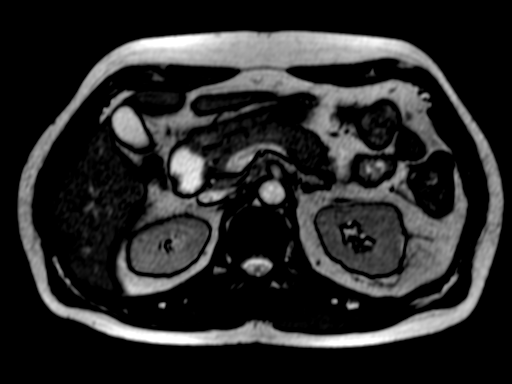
[im 37/61]
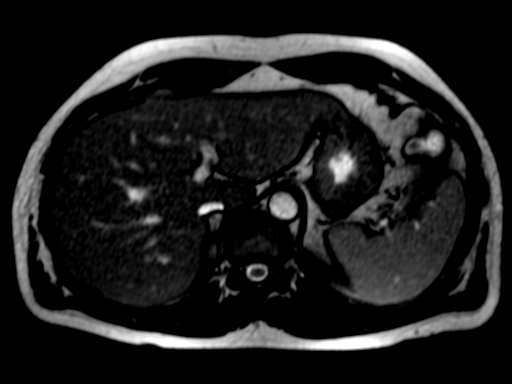
[im 49/61]
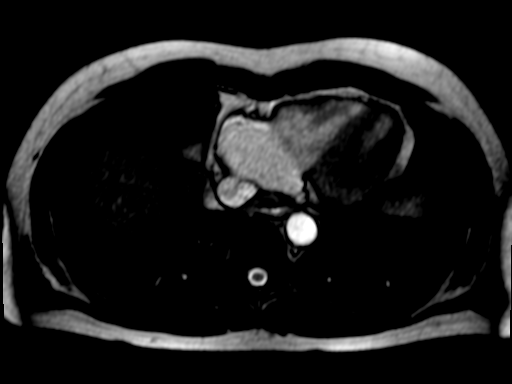
[im 61/61]
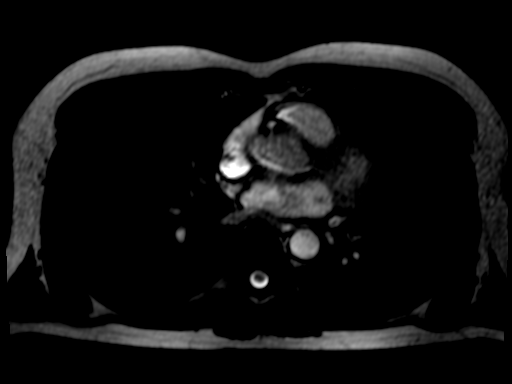

[Series 7: T2 fat-sat · axial · 6.0mm · 1.09mm/px · z∈[-102,+136]mm · 4 of 34 slices shown]
[im 1/34]
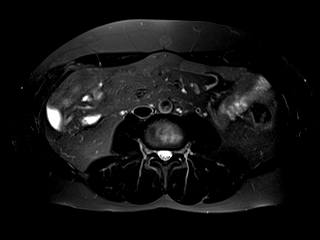
[im 12/34]
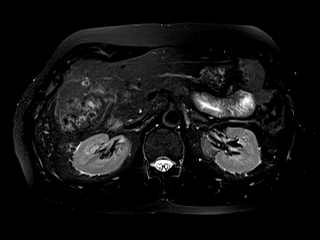
[im 23/34]
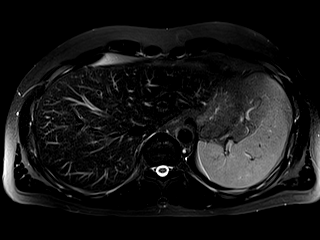
[im 34/34]
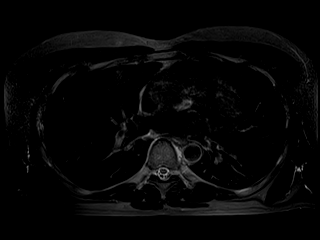

[Series 8: ep2d_diff_b50_500_800_p2_trig · axial · 6.0mm · 1.82mm/px · z∈[-105,+139]mm · 8 of 105 slices shown]
[im 1/105]
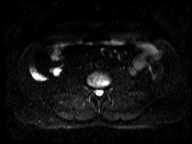
[im 21/105]
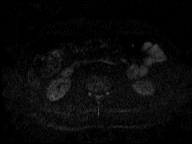
[im 32/105]
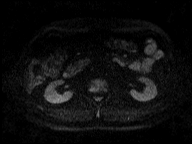
[im 42/105]
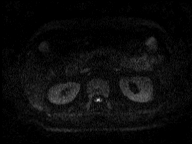
[im 63/105]
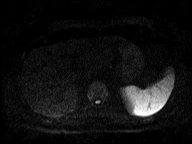
[im 73/105]
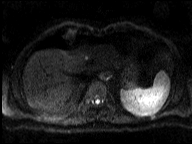
[im 84/105]
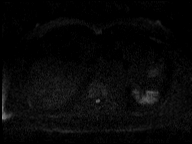
[im 105/105]
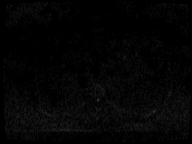

[Series 9: ep2d_diff_b50_500_800_p2_trig_adc · axial · 6.0mm · 1.82mm/px · z∈[-105,+139]mm · 4 of 35 slices shown]
[im 1/35]
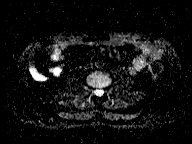
[im 12/35]
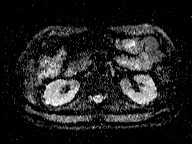
[im 23/35]
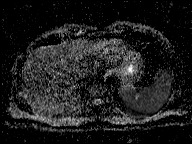
[im 35/35]
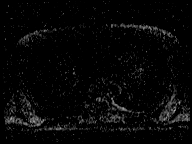

[36 of 48 positions shown; findings below may reference images not displayed]

FINDINGS: Lower chest:  The visualized lower chest appears unremarkable.

Hepatobiliary: The previously demonstrated small lesion in the dome
of the left hepatic lobe appears unchanged. This is located in
segment 2 and measures 7 mm maximally (image [DATE]). The high T2
hyperintensity within this lesion remains consistent with a cyst or
small hemangioma. No new or enlarging hepatic lesions. No evidence
of gallstones, gallbladder wall thickening or biliary dilatation.

Pancreas: Unremarkable. No pancreatic ductal dilatation or
surrounding inflammatory changes.

Spleen: Normal in size without focal abnormality.

Adrenals/Urinary Tract: Both adrenal glands appear normal. The
kidneys appear normal, without hydronephrosis.

Stomach/Bowel: The stomach appears unremarkable for its degree of
distension. No evidence of bowel wall thickening, distention or
surrounding inflammatory change.

Vascular/Lymphatic: There are no enlarged abdominal lymph nodes. No
significant vascular findings are present.

Other: No evidence of abdominal wall hernia or ascites.

Musculoskeletal: No acute or significant osseous findings.
IMPRESSION: 1. Stable small cyst or hemangioma within the left hepatic lobe.
2. No suspicious hepatic findings.
3. Otherwise unremarkable abdominal MRI.

## 2021-04-10 ENCOUNTER — Ambulatory Visit: Payer: BC Managed Care – PPO | Admitting: Gastroenterology

## 2021-05-21 ENCOUNTER — Encounter: Payer: Self-pay | Admitting: Gastroenterology

## 2021-05-21 ENCOUNTER — Ambulatory Visit (INDEPENDENT_AMBULATORY_CARE_PROVIDER_SITE_OTHER): Payer: 59 | Admitting: Gastroenterology

## 2021-05-21 VITALS — BP 120/82 | HR 64 | Ht 67.0 in | Wt 174.8 lb

## 2021-05-21 DIAGNOSIS — Z85038 Personal history of other malignant neoplasm of large intestine: Secondary | ICD-10-CM

## 2021-05-21 DIAGNOSIS — R1319 Other dysphagia: Secondary | ICD-10-CM

## 2021-05-21 DIAGNOSIS — K449 Diaphragmatic hernia without obstruction or gangrene: Secondary | ICD-10-CM

## 2021-05-21 NOTE — Progress Notes (Signed)
[facility] Progress Note  Chief Complaint: Esophageal dysphagia.  Subjective  History:  Beckhem was here for reevaluation of dysphagia.  He was the reason he saw me in August 2021, and EGD and screening colonoscopy were done at that time.  Endoscopic findings suggested possible achalasia with resistance at EG junction but no clear stricture seen.  Subsequent barium swallow had similar findings, with delayed passage of liquid and barium tablet.  A small hiatal hernia was also seen in that study.   Subsequent esophageal manometry in October 2021 showed increased baseline EG J pressure with normal relaxation at 100% peristaltic contractions.  Thus it was not consistent with achalasia. He had a cecal polyp with initial biopsies negative for malignancy, then attempt by Dr. 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  Kyrell underwent right hemicolectomy by Dr. Johney Maine last December.  Joriel reports feeling "great" from a lower digestive standpoint.  Bowel habits are regular, no rectal bleeding, and he feels that surgery went quite well.  He was glad to learn that it was an early stage (T1N0) malignancy requiring no further treatment.   However, his dysphagia has slowly worsened since I last saw him.  Mostly solid food gets hung up, but sometimes liquids feel as if they are "backing up". His appetite is remained good and his weight stable.  ROS: Cardiovascular:  no chest pain Respiratory: no dyspnea Remainder of systems negative except as above The patient's Past Medical, Family and Social History were reviewed and are on file in the EMR. Past Medical History:  Diagnosis Date   Allergy    Arthritis    lower back   Asthma    Cancer ([place])    Colon   Complication of anesthesia    GERD (gastroesophageal reflux disease)    Osteoid osteoma    PONV (postoperative nausea and vomiting)    Past Surgical History:  Procedure Laterality Date    BIOPSY  08/27/2020   Procedure: BIOPSY;  Surgeon: Irving Copas., MD;  Location: [facility];  Service: Gastroenterology;;   COLONOSCOPY  06/13/2020   COLONOSCOPY WITH PROPOFOL N/A 08/27/2020   Procedure: COLONOSCOPY WITH PROPOFOL;  Surgeon: Irving Copas., MD;  Location: [facility];  Service: Gastroenterology;  Laterality: N/A;   DENTAL SURGERY     ESOPHAGEAL MANOMETRY N/A 08/31/2020   Procedure: ESOPHAGEAL MANOMETRY (EM);  Surgeon: Mauri Pole, MD;  Location: [facility];  Service: Endoscopy;  Laterality: N/A;   NASAL RECONSTRUCTION     OSTEOTOMY FIBULA     osteoasteoma   SUBMUCOSAL LIFTING INJECTION  08/27/2020   Procedure: SUBMUCOSAL LIFTING INJECTION;  Surgeon: Rush Landmark Telford Nab., MD;  Location: [facility];  Service: Gastroenterology;;   SUBMUCOSAL TATTOO INJECTION  08/27/2020   Procedure: SUBMUCOSAL TATTOO INJECTION;  Surgeon: Irving Copas., MD;  Location: [facility];  Service: Gastroenterology;;   UPPER GI ENDOSCOPY  06/13/2020   WISDOM TOOTH EXTRACTION     Right hemicolectomy 10/2020 - [facility]  Objective:  Med list reviewed  Current Outpatient Medications:    cetirizine (ZYRTEC) 10 MG tablet, Take 10 mg by mouth daily., Disp: , Rfl:    pantoprazole (PROTONIX) 40 MG tablet, Take 1 tablet (40 mg total) by mouth daily. Further refills need to come from new provider, Disp: 90 tablet, Rfl: 0   Vital signs in last 24 hrs: Vitals:   05/21/21 0908  BP: 120/82  Pulse: 64   Wt Readings from Last 3 Encounters:  05/21/21 174 lb 12.8 oz (79.3 kg)  10/26/20 172 lb 13.5 oz (78.4 kg)  10/17/20 (P) 171 lb (77.6 kg)    Physical Exam  He is well-appearing, good muscle mass, normal vocal quality HEENT: sclera anicteric, oral mucosa moist without lesions Neck: supple, no thyromegaly, JVD or lymphadenopathy Cardiac: RRR without murmurs, S1S2 heard, no peripheral edema Pulm: clear to auscultation bilaterally, normal RR and effort  noted Abdomen: soft, no tenderness, with active bowel sounds. No guarding or palpable hepatosplenomegaly. Skin; warm and dry, no jaundice or rash  Labs:   ___________________________________________ Radiologic studies:  CLINICAL DATA:  Dysphagia with solids and liquids. Recent endoscopy demonstrating esophageal dilatation and abnormal motility suspicious for achalasia. Spastic lower esophageal sphincter.   EXAM: ESOPHOGRAM/BARIUM SWALLOW   TECHNIQUE: Single contrast examination was performed using  thin barium.   FLUOROSCOPY TIME:  Fluoroscopy Time:  4 minutes and 24 seconds   Radiation Exposure Index (if provided by the fluoroscopic device): 33.5 mGy   Number of Acquired Spot Images: 0   COMPARISON:  Endoscopy report of 06/13/2020   FINDINGS: Evaluation of esophageal motility demonstrates a primary esophageal wave within the upper and mid esophagus, most apparent on the first series. Somewhat decreased primary wave in the distal esophagus with proximal escape waves, contrast stasis throughout the upper and mid esophagus.   Full column evaluation of the esophagus demonstrates a small hiatal hernia with an area of moderate persistent narrowing at the gastroesophageal junction, including on image 231 of series 4.   There may be minimal distal esophageal dilatation.   With water swallows in upright position, the contrast clears the esophagus, including on series 8.   A 13 mm barium tablet has persistent delayed passage at the level of the distal esophagus.   IMPRESSION: 1. No typical findings of advanced achalasia. Suboptimal primary peristaltic wave within the distal esophagus with small hiatal hernia and moderate narrowing at the gastroesophageal junction. 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 Very early or mild achalasia could look similar, especially given the  endoscopy findings. 2. Obstruction of 13 mm barium tablet at the gastroesophageal junction, secondary to above.     Electronically Signed   By: Abigail Miyamoto M.D.   On: 06/21/2020 12:23  ____________________________________________ Other:   _____________________________________________ Assessment & Plan  Assessment: Encounter Diagnoses  Name Primary?   Esophageal dysphagia Yes   Personal history of colon cancer    Hiatal hernia    His dysphagia is slowly worsening, and I still suspect this is most consistent with a motility disorder.  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  There could have been a subtle stricture not evident on upper endoscopy, but it would be unusual not to see one severe enough to cause reported symptoms and findings on barium study.  Esophageal manometry not consistent with achalasia, but still had evidence of increased resting LES tone.   Plan: Upper endoscopy with balloon dilation EG junction. If symptoms not improved afterward, repeat barium study with liquid and tablet.  If similar findings, consult with motility specialist colleague regarding medical or endoscopic treatments.  30 minutes were spent on this encounter (including chart review, history/exam, counseling/coordination of care, and documentation) > 50% of that time was spent on counseling and coordination of care.  Topics discussed included: See above.  Nelida Meuse III

## 2021-05-21 NOTE — Patient Instructions (Addendum)
If you are age 52 or older, your body mass index should be between 23-30. Your Body mass index is 27.38 kg/m. If this is out of the aforementioned range listed, please consider follow up with your Primary Care Provider.  If you are age 64 or younger, your body mass index should be between 19-25. Your Body mass index is 27.38 kg/m. If this is out of the aformentioned range listed, please consider follow up with your Primary Care Provider.   __________________________________________________________  The [facility] providers would like to encourage you to use [facility] to communicate with providers for non-urgent requests or questions.  Due to long hold times on the telephone, sending your provider a message by [facility] may be a faster and more efficient way to get a response.  Please allow 48 business hours for a response.  Please remember that this is for non-urgent requests.   You have been scheduled for an endoscopy. Please follow written instructions given to you at your visit today. If you use inhalers (even only as needed), please bring them with you on the day of your procedure.   It was a pleasure to see you today!  Thank you for trusting me with your gastrointestinal care!

## 2021-05-28 ENCOUNTER — Encounter: Payer: Self-pay | Admitting: Gastroenterology

## 2021-05-29 ENCOUNTER — Encounter: Payer: Self-pay | Admitting: Gastroenterology

## 2021-05-29 ENCOUNTER — Other Ambulatory Visit: Payer: Self-pay

## 2021-05-29 ENCOUNTER — Ambulatory Visit (AMBULATORY_SURGERY_CENTER): Payer: 59 | Admitting: Gastroenterology

## 2021-05-29 VITALS — BP 125/76 | HR 53 | Temp 97.6°F | Resp 14 | Ht 67.0 in | Wt 174.0 lb

## 2021-05-29 DIAGNOSIS — K224 Dyskinesia of esophagus: Secondary | ICD-10-CM

## 2021-05-29 DIAGNOSIS — K219 Gastro-esophageal reflux disease without esophagitis: Secondary | ICD-10-CM | POA: Diagnosis not present

## 2021-05-29 DIAGNOSIS — R1319 Other dysphagia: Secondary | ICD-10-CM | POA: Diagnosis present

## 2021-05-29 DIAGNOSIS — K229 Disease of esophagus, unspecified: Secondary | ICD-10-CM | POA: Diagnosis not present

## 2021-05-29 MED ORDER — SODIUM CHLORIDE 0.9 % IV SOLN: 500.0000 mL | Freq: Once | INTRAVENOUS | Status: DC

## 2021-05-29 NOTE — Progress Notes (Signed)
VS by CW. ?

## 2021-05-29 NOTE — Progress Notes (Signed)
No problems noted in the recovery room. maw 

## 2021-05-29 NOTE — Progress Notes (Signed)
pt tolerated well. VSS. awake and to recovery. Report given to RN. Bite block left insitu to recovery. 

## 2021-05-29 NOTE — Op Note (Signed)
[address] Patient Name: Sean Hernandez Procedure Date: 05/29/2021 2:10 PM MRN: [id number] Endoscopist: Mallie Mussel L. Loletha Carrow , MD Age: 50 Referring MD:  Date of Birth: 11-Jun-1971 Gender: Male Account #: [id number] Procedure:                Upper GI endoscopy Indications:              Esophageal dysphagia                           (see 05/21/21 office note for clinical details)                            prior esophageal manometry with elevated resting                            LES and normal relaxation with normal peristalsis.                            Barium study at that time with evidence distal                            motility disorder Medicines:                Monitored Anesthesia Care Procedure:                Pre-Anesthesia Assessment:                           - Prior to the procedure, a History and Physical                            was performed, and patient medications and                            allergies were reviewed. The patient's tolerance of                            previous anesthesia was also reviewed. The risks                            and benefits of the procedure and the sedation                            options and risks were discussed with the patient.                            All questions were answered, and informed consent                            was obtained. Prior Anticoagulants: The patient has                            taken no previous anticoagulant or antiplatelet  agents. ASA Grade Assessment: II - A patient with                            mild systemic disease. After reviewing the risks                            and benefits, the patient was deemed in                            satisfactory condition to undergo the procedure.                           After obtaining informed consent, the endoscope was                            passed under direct vision. Throughout the                             procedure, the patient's blood pressure, pulse, and                            oxygen saturations were monitored continuously. The                            Endoscope was introduced through the mouth, and                            advanced to the second part of duodenum. The upper                            GI endoscopy was accomplished without difficulty.                            The patient tolerated the procedure well. Scope In: Scope Out: Findings:                 The lumen of the lower third of the esophagus was                            mildly dilated.                           Abnormal motility was noted at the lower esophageal                            sphincter. The distal esophagus/lower esophageal                            sphincter is spastic, but gives up passage to the                            endoscope. A TTS dilator was passed through the  scope. Dilation with an 18-19-20 mm balloon dilator                            was performed to 20 mm (30-45 sec at each diameter,                            deflated and site checked at each diameter). The                            dilation site was examined following endoscope                            reinsertion and showed no change.                           Diffuse mild mucosal changes characterized by                            granularity were found in the lower third of the                            esophagus. Multiple biopsies were obtained in the                            middle third of the esophagus and in the lower                            third of the esophagus with cold forceps for                            histology. Nonspeficic finding, not typical for EoE.                           The stomach was normal.                           The cardia and gastric fundus were normal on                            retroflexion.                           The examined duodenum was  normal. Complications:            No immediate complications. Estimated Blood Loss:     Estimated blood loss was minimal. Impression:               - Dilation in the lower third of the esophagus.                           - Abnormal esophageal motility. Dilated.                           - Granular mucosa in the esophagus.                           -  Normal stomach.                           - Normal examined duodenum.                           - Multiple biopsies were obtained in the middle                            third of the esophagus and in the lower third of                            the esophagus. Recommendation:           - Patient has a contact number available for                            emergencies. The signs and symptoms of potential                            delayed complications were discussed with the                            patient. Return to normal activities tomorrow.                            Written discharge instructions were provided to the                            patient.                           - Resume previous diet.                           - Continue present medications.                           - Await pathology results. If normal and no                            improvement in dysphagia after today's dilation,                            repeat barium study and consider BoTox treatment of                            LES. Sheritta Deeg L. Loletha Carrow, MD 05/29/2021 2:47:46 PM This report has been signed electronically.

## 2021-05-29 NOTE — Patient Instructions (Addendum)
You may resume your current medications today. Await biopsy results.  May take 1-3 weeks to receive pathology results. If biopsies are normal and no improvement in difficulty swallowing after today's dilatation, repeat barium study and consider BoTox treatment of LES. Please call if any questions or concerns.       YOU HAD AN ENDOSCOPIC PROCEDURE TODAY AT [facility]:   Refer to the procedure report that was given to you for any specific questions about what was found during the examination.  If the procedure report does not answer your questions, please call your gastroenterologist to clarify.  If you requested that your care partner not be given the details of your procedure findings, then the procedure report has been included in a sealed envelope for you to review at your convenience later.  YOU SHOULD EXPECT: Some feelings of bloating in the abdomen. Passage of more gas than usual.  Walking can help get rid of the air that was put into your GI tract during the procedure and reduce the bloating. If you had a lower endoscopy (such as a colonoscopy or flexible sigmoidoscopy) you may notice spotting of blood in your stool or on the toilet paper. If you underwent a bowel prep for your procedure, you may not have a normal bowel movement for a few days.  Please Note:  You might notice some irritation and congestion in your nose or some drainage.  This is from the oxygen used during your procedure.  There is no need for concern and it should clear up in a day or so.  SYMPTOMS TO REPORT IMMEDIATELY:   Following upper endoscopy (EGD)  Vomiting of blood or coffee ground material  New chest pain or pain under the shoulder blades  Painful or persistently difficult swallowing  New shortness of breath  Fever of 100F or higher  Black, tarry-looking stools  For urgent or emergent issues, a gastroenterologist can be reached at any hour by calling [phone]. Do not use MyChart  messaging for urgent concerns.    DIET:  We do recommend a small meal at first, but then you may proceed to your regular diet.  Drink plenty of fluids but you should avoid alcoholic beverages for 24 hours.  ACTIVITY:  You should plan to take it easy for the rest of today and you should NOT DRIVE or use heavy machinery until tomorrow (because of the sedation medicines used during the test).    FOLLOW UP: Our staff will call the number listed on your records 48-72 hours following your procedure to check on you and address any questions or concerns that you may have regarding the information given to you following your procedure. If we do not reach you, we will leave a message.  We will attempt to reach you two times.  During this call, we will ask if you have developed any symptoms of COVID 19. If you develop any symptoms (ie: fever, flu-like symptoms, shortness of breath, cough etc.) before then, please call [phone].  If you test positive for Covid 19 in the 2 weeks post procedure, please call and report this information to [place].    If any biopsies were taken you will be contacted by phone or by letter within the next 1-3 weeks.  Please call us at [phone] if you have not heard about the biopsies in 3 weeks.    SIGNATURES/CONFIDENTIALITY: You and/or your care partner have signed paperwork which will be entered into your electronic medical  record.  These signatures attest to the fact that that the information above on your After Visit Summary has been reviewed and is understood.  Full responsibility of the confidentiality of this discharge information lies with you and/or your care-partner.

## 2021-05-31 ENCOUNTER — Telehealth: Payer: Self-pay | Admitting: *Deleted

## 2021-05-31 NOTE — Telephone Encounter (Signed)
  Follow up Call-  Call back number 05/29/2021 06/13/2020  Post procedure Call Back phone  # [phone]  Permission to leave phone message Yes Yes  Some recent data might be hidden     Patient questions:  Do you have a fever, pain , or abdominal swelling? No. Pain Score  0 *  Have you tolerated food without any problems? Yes.    Have you been able to return to your normal activities? Yes.    Do you have any questions about your discharge instructions: Diet   No. Medications  No. Follow up visit  No.  Do you have questions or concerns about your Care? No.  Actions: * If pain score is 4 or above: No action needed, pain <4.  Have you developed a fever since your procedure? no  2.   Have you had an respiratory symptoms (SOB or cough) since your procedure? no  3.   Have you tested positive for COVID 19 since your procedure no  4.   Have you had any family members/close contacts diagnosed with the COVID 19 since your procedure?  no   If yes to any of these questions please route to Joylene John, RN and Joella Prince, RN

## 2021-06-05 ENCOUNTER — Other Ambulatory Visit: Payer: Self-pay

## 2021-06-05 DIAGNOSIS — R1319 Other dysphagia: Secondary | ICD-10-CM

## 2021-06-13 ENCOUNTER — Other Ambulatory Visit: Payer: Self-pay

## 2021-06-13 ENCOUNTER — Ambulatory Visit (HOSPITAL_COMMUNITY)
Admission: RE | Admit: 2021-06-13 | Discharge: 2021-06-13 | Disposition: A | Discharge status: Home / Self Care | Payer: 59 | Source: Ambulatory Visit | Attending: Gastroenterology | Admitting: Gastroenterology

## 2021-06-13 DIAGNOSIS — R1319 Other dysphagia: Secondary | ICD-10-CM | POA: Diagnosis not present

## 2021-06-13 IMAGING — RF DG ESOPHAGUS
8 of 9 series · 12 of 24 positions shown · non-contrast
Comparison: Esophagram [DATE]
COMPARISON: Esophagram [DATE]

Addendum:
CLINICAL DATA: Dysphagia.

EXAM:
ESOPHOGRAM / BARIUM SWALLOW / BARIUM TABLET STUDY
TECHNIQUE: Combined double contrast and single contrast examination performed
using effervescent crystals, thick barium liquid, and thin barium
liquid. The patient was observed with fluoroscopy swallowing a 13 mm
barium sulphate tablet.
FLUOROSCOPY TIME:  Fluoroscopy Time:  2 minutes 42 second
Radiation Exposure Index (if provided by the fluoroscopic device):
Number of Acquired Spot Images: 9

[Series 1: cp_standard · 0.36mm/px · 2 of 207 frames shown (1 of 8)]
[frame 32/207]
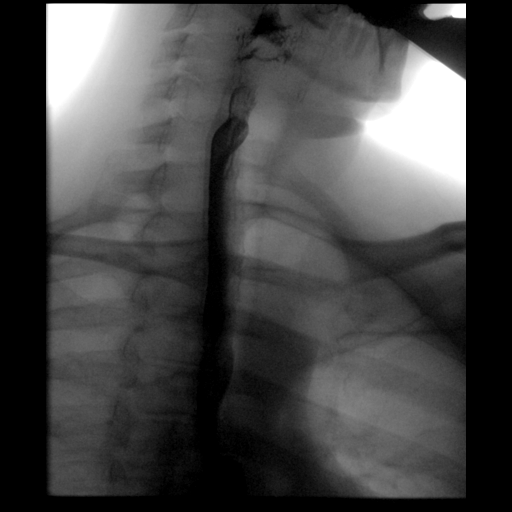
[frame 176/207]
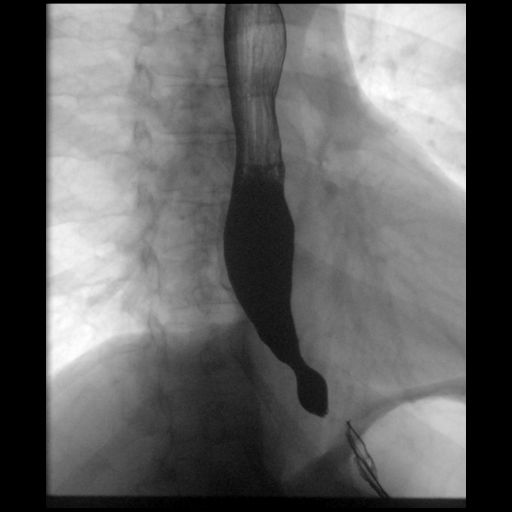

[Series 2: cp_standard · 0.36mm/px · 1 of 157 frames shown (2 of 8)]
[frame 134/157]
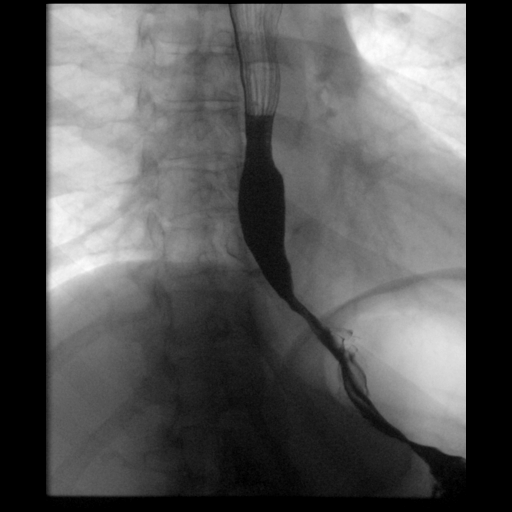

[Series 3: cp_standard · 0.36mm/px · 2 of 217 frames shown (3 of 8)]
[frame 33/217]
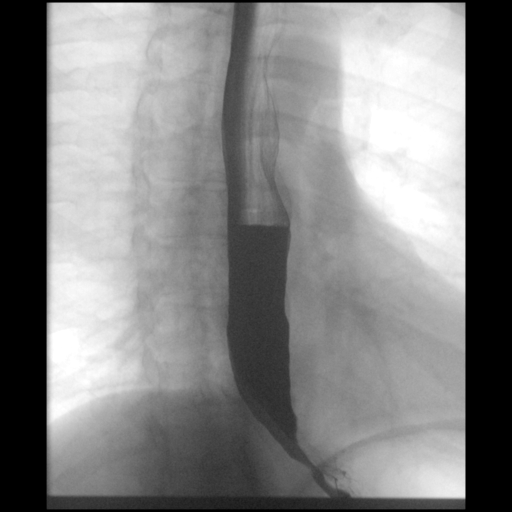
[frame 185/217]
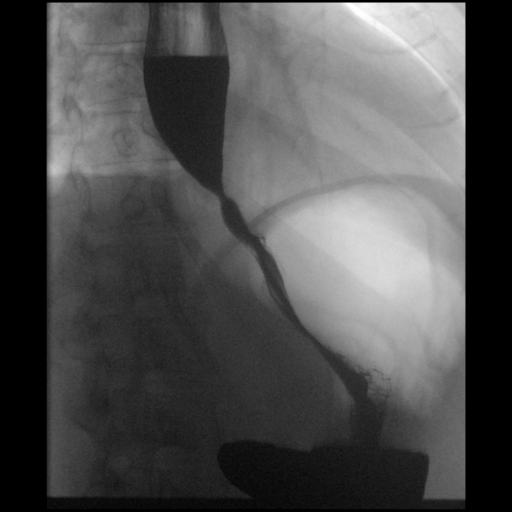

[Series 4: cp_standard · 0.37mm/px · 2 of 221 frames shown (4 of 8)]
[frame 34/221]
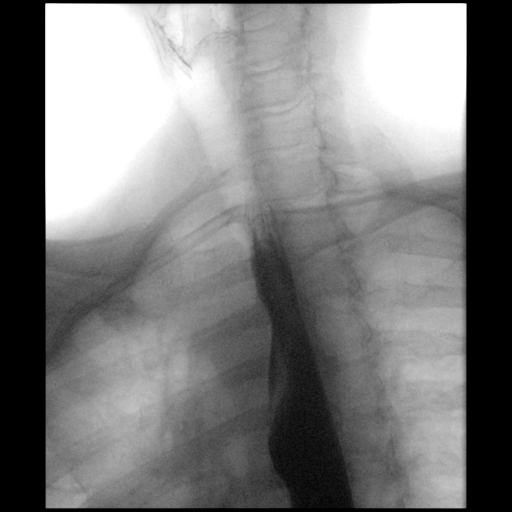
[frame 221/221]
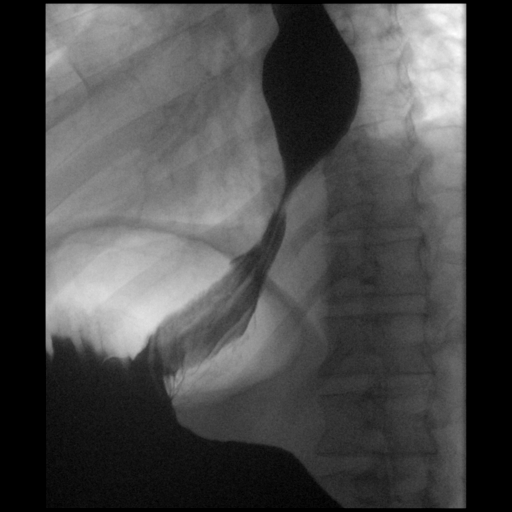

[Series 5: cp_standard · 0.37mm/px · 2 of 179 frames shown (5 of 8)]
[frame 57/179]
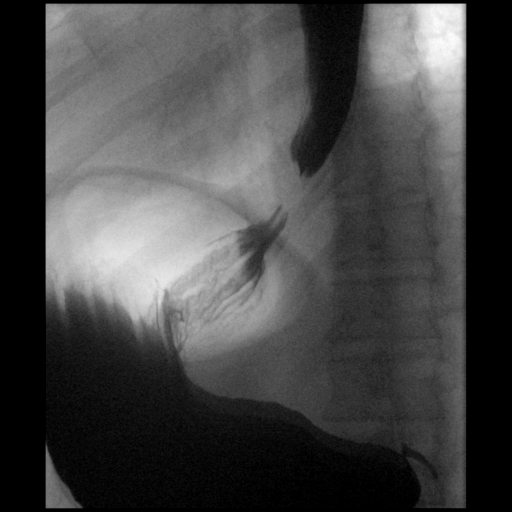
[frame 153/179]
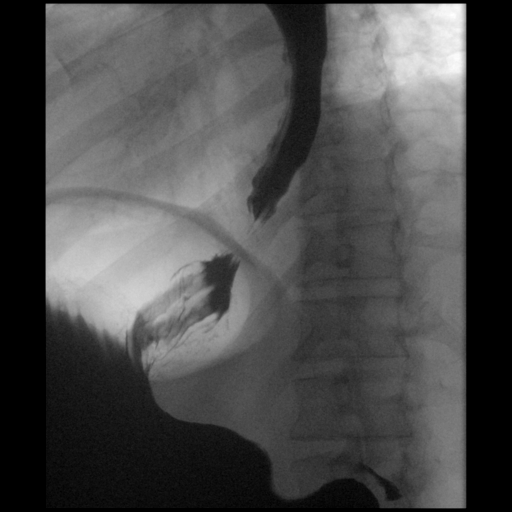

[Series 6: cp_standard · 0.37mm/px · 1 of 374 frames shown (6 of 8)]
[frame 64/374]
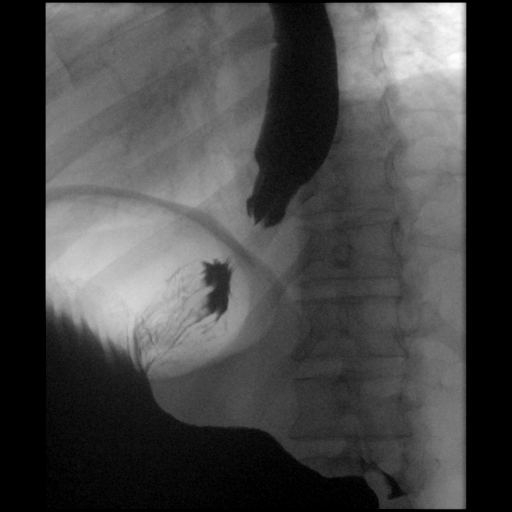

[Series 7: cp_standard · 0.18mm/px · 1 of 1 slices shown (7 of 8)]
[im 1/1]
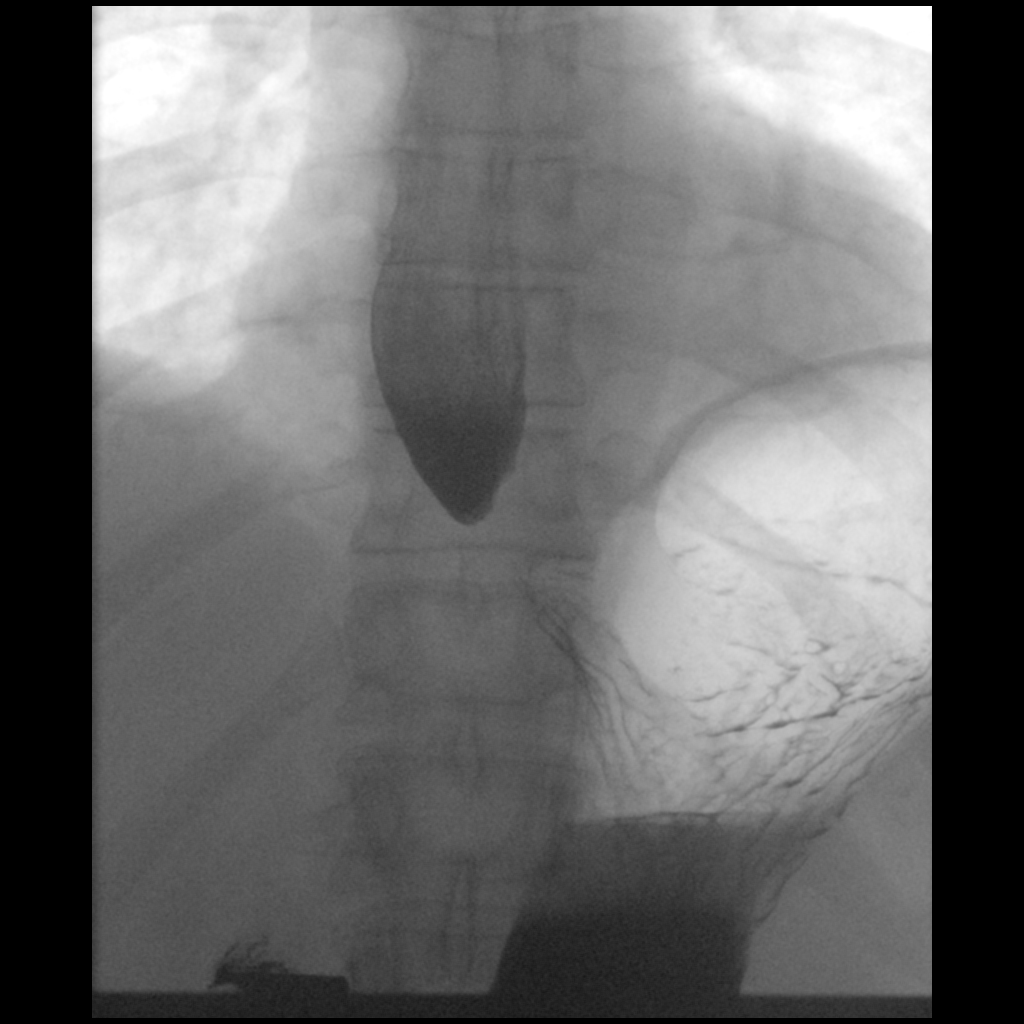

[Series 9: cp_standard · 0.18mm/px · 1 of 1 slices shown (8 of 8)]
[im 1/1]
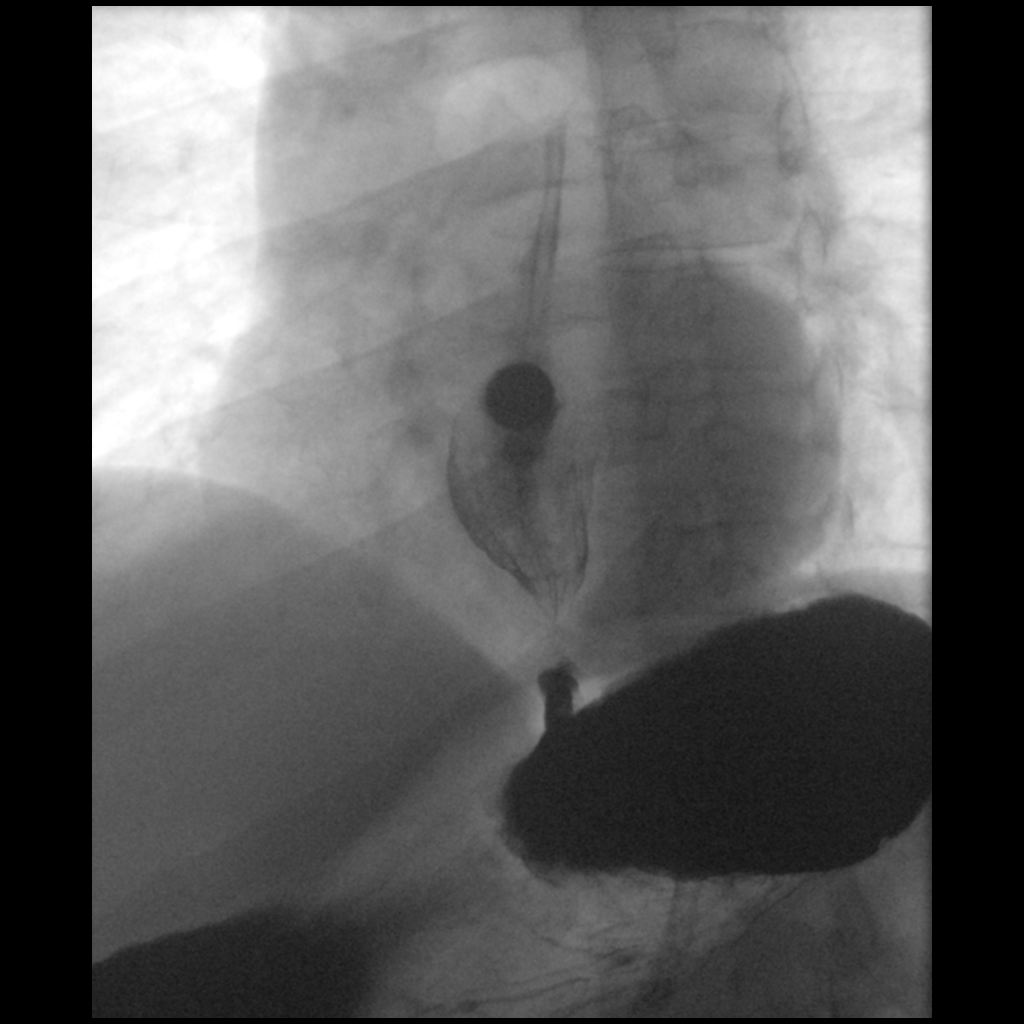

[12 of 24 positions shown; findings below may reference images not displayed]

FINDINGS: The esophagus is diffusely dilated with decreased peristalsis, with
progression since the prior study. Pharyngeal phase of swallowing is
normal. No aspiration.

There is a medium length smooth stricture of the distal esophagus
just above a small hiatal hernia. No associated mucosal irregularity
or ulceration. Stricture is similar in location and degree to the
prior study. The esophagus was slow to drain through the stricture
with retained barium noted in the esophagus.

Barium tablet did not pass through the stricture

There was a very small hiatal hernia and minimal gastroesophageal
reflux.
IMPRESSION: Dilated esophagus with decreased peristalsis which appears
progressive from the prior study [2R].

Medium length smooth stricture distal esophagus is similar to the
prior study. Barium tablet did not pass. Findings could be due to
reflux esophagitis however there is only a small hiatal hernia and
minimal gastroesophageal reflux. Also consider achalasia.

ADDENDUM:
Results were discussed with Dr. BRANDEBOURG. I believe achalasia is the
most likely diagnosis. There is progression of esophageal dilatation
and retention of barium compared to the prior study 1 year ago.

*** End of Addendum ***
FINDINGS: The esophagus is diffusely dilated with decreased peristalsis, with
progression since the prior study. Pharyngeal phase of swallowing is
normal. No aspiration.

There is a medium length smooth stricture of the distal esophagus
just above a small hiatal hernia. No associated mucosal irregularity
or ulceration. Stricture is similar in location and degree to the
prior study. The esophagus was slow to drain through the stricture
with retained barium noted in the esophagus.

Barium tablet did not pass through the stricture

There was a very small hiatal hernia and minimal gastroesophageal
reflux.
IMPRESSION: Dilated esophagus with decreased peristalsis which appears
progressive from the prior study [2R].

Medium length smooth stricture distal esophagus is similar to the
prior study. Barium tablet did not pass. Findings could be due to
reflux esophagitis however there is only a small hiatal hernia and
minimal gastroesophageal reflux. Also consider achalasia.

## 2021-06-24 NOTE — Telephone Encounter (Signed)
Referral, records, demographic and insurance information faxed to Dr. Leroy Sea at [facility] [phone], [phone]).

## 2021-07-01 NOTE — Telephone Encounter (Signed)
I spoke to Dr. Leroy Sea about the case, and he recommended a repeat manometry study to help him make the best decision about treatment.  Please contact Sean Hernandez about this and make arrangements for it to get done in the [place] endoscopy lab.  Sean Hernandez could opt to delay that until he sees Dr. Golden Pop and have the manometry done with [facility] if he prefers, though I think that will set the timeline back for him.  - Dr. Loletha Carrow

## 2021-07-09 ENCOUNTER — Other Ambulatory Visit: Payer: Self-pay

## 2021-07-09 DIAGNOSIS — K220 Achalasia of cardia: Secondary | ICD-10-CM

## 2021-07-09 DIAGNOSIS — R1319 Other dysphagia: Secondary | ICD-10-CM

## 2021-07-09 NOTE — Telephone Encounter (Signed)
Patient has been scheduled for an esophageal manometry at [facility] on Wednesday, 08/07/21 at 12:30 PM. Patient has been notified of appt information via my chart.  CASE ID: [id number]

## 2021-08-07 ENCOUNTER — Encounter (HOSPITAL_COMMUNITY)
Admission: RE | Disposition: A | Discharge status: Home / Self Care | Payer: Self-pay | Source: Home / Self Care | Attending: Gastroenterology

## 2021-08-07 ENCOUNTER — Encounter (HOSPITAL_COMMUNITY): Payer: Self-pay | Admitting: Gastroenterology

## 2021-08-07 ENCOUNTER — Ambulatory Visit (HOSPITAL_COMMUNITY)
Admission: RE | Admit: 2021-08-07 | Discharge: 2021-08-07 | Disposition: A | Discharge status: Home / Self Care | Payer: 59 | Attending: Gastroenterology | Admitting: Gastroenterology

## 2021-08-07 DIAGNOSIS — K220 Achalasia of cardia: Secondary | ICD-10-CM | POA: Diagnosis not present

## 2021-08-07 DIAGNOSIS — R1319 Other dysphagia: Secondary | ICD-10-CM

## 2021-08-07 DIAGNOSIS — R1310 Dysphagia, unspecified: Secondary | ICD-10-CM | POA: Diagnosis present

## 2021-08-07 HISTORY — PX: ESOPHAGEAL MANOMETRY: SHX5429

## 2021-08-07 SURGERY — MANOMETRY, ESOPHAGUS
Anesthesia: Choice

## 2021-08-07 MED ORDER — LIDOCAINE VISCOUS HCL 2 % MT SOLN
OROMUCOSAL | Status: AC
  Filled 2021-08-07: qty 15

## 2021-08-07 SURGICAL SUPPLY — 2 items
FACESHIELD LNG OPTICON STERILE (SAFETY) IMPLANT
GLOVE BIO SURGEON STRL SZ8 (GLOVE) ×4 IMPLANT

## 2021-08-07 NOTE — Progress Notes (Signed)
Esophageal manometry performed per protocol without complications.  Patient tolerated well. 

## 2021-08-08 DIAGNOSIS — K220 Achalasia of cardia: Secondary | ICD-10-CM

## 2021-08-21 ENCOUNTER — Telehealth: Payer: Self-pay

## 2021-08-21 NOTE — Telephone Encounter (Signed)
.  Caller name:Nichollas Chatmon   Caller callback [phone]  Encourage patient to contact the pharmacy for refills or they can request refills through [facility]:  (03/30/2020 with Elyn Aquas  NEXT APPOINTMENT DATE: 11/29/2021  MEDICATION NAME & DOSE:pantoprazole (PROTONIX) 40 MG tablet  90 day supply   Is the patient out of medication? yes  PHARMACY: [facility]   Let patient know to contact pharmacy at the end of the day to make sure medication is ready.  Please notify patient to allow 48-72 hours to process  (CLINICAL TO FILL OR ROUTE PER PROTOCOLS)

## 2021-08-21 NOTE — Telephone Encounter (Signed)
Please call patient and schedule a same day or virtual visit to get medication filled and for this issue to be addressed due to it being over a year since he was seen and TOC appt not being until Jan.

## 2021-08-23 ENCOUNTER — Other Ambulatory Visit: Payer: Self-pay | Admitting: Gastroenterology

## 2021-08-23 ENCOUNTER — Telehealth: Payer: Self-pay | Admitting: Gastroenterology

## 2021-08-23 ENCOUNTER — Other Ambulatory Visit: Payer: Self-pay

## 2021-08-23 DIAGNOSIS — R0789 Other chest pain: Secondary | ICD-10-CM

## 2021-08-23 DIAGNOSIS — K222 Esophageal obstruction: Secondary | ICD-10-CM

## 2021-08-23 DIAGNOSIS — K2100 Gastro-esophageal reflux disease with esophagitis, without bleeding: Secondary | ICD-10-CM

## 2021-08-23 MED ORDER — PANTOPRAZOLE SODIUM 40 MG PO TBEC: 40.0000 mg | DELAYED_RELEASE_TABLET | Freq: Every day | ORAL | 0 refills | Status: DC

## 2021-08-23 NOTE — Telephone Encounter (Signed)
Refill has been given as requested

## 2021-08-23 NOTE — Telephone Encounter (Signed)
Inbound call from pt requesting a call back stating that he is trying to get his pantoprazole switch from his PCP to Dr. Loletha Carrow. Please advise. Thank you.

## 2021-08-26 ENCOUNTER — Telehealth: Payer: 59 | Admitting: Family Medicine

## 2021-09-02 NOTE — Telephone Encounter (Signed)
Called patient back and left voicemail to make Med Check appt

## 2021-11-29 ENCOUNTER — Encounter: Payer: Self-pay | Admitting: Family Medicine

## 2021-11-29 ENCOUNTER — Ambulatory Visit (INDEPENDENT_AMBULATORY_CARE_PROVIDER_SITE_OTHER): Payer: 59 | Admitting: Family Medicine

## 2021-11-29 VITALS — BP 128/76 | HR 57 | Temp 98.1°F | Resp 16 | Ht 67.0 in | Wt 173.8 lb

## 2021-11-29 DIAGNOSIS — Z130 Encounter for screening for diseases of the blood and blood-forming organs and certain disorders involving the immune mechanism: Secondary | ICD-10-CM | POA: Diagnosis not present

## 2021-11-29 DIAGNOSIS — C180 Malignant neoplasm of cecum: Secondary | ICD-10-CM | POA: Diagnosis not present

## 2021-11-29 DIAGNOSIS — R12 Heartburn: Secondary | ICD-10-CM

## 2021-11-29 DIAGNOSIS — Z13220 Encounter for screening for lipoid disorders: Secondary | ICD-10-CM | POA: Diagnosis not present

## 2021-11-29 DIAGNOSIS — K220 Achalasia of cardia: Secondary | ICD-10-CM

## 2021-11-29 DIAGNOSIS — Z23 Encounter for immunization: Secondary | ICD-10-CM | POA: Diagnosis not present

## 2021-11-29 DIAGNOSIS — B001 Herpesviral vesicular dermatitis: Secondary | ICD-10-CM

## 2021-11-29 DIAGNOSIS — Z131 Encounter for screening for diabetes mellitus: Secondary | ICD-10-CM

## 2021-11-29 MED ORDER — VALACYCLOVIR HCL 1 G PO TABS: 2000.0000 mg | ORAL_TABLET | Freq: Once | ORAL | 0 refills | Status: AC

## 2021-11-29 NOTE — Progress Notes (Signed)
Subjective:  Patient ID: Sean Hernandez, male    DOB: 06-30-1971  Age: 51 y.o. MRN: [id number]  CC:  Chief Complaint  Patient presents with   Establish Care    Pt doing okay no concerns, has had some GI issues over the last year, had a colon resection 1 year ago, no action required pt FYI     HPI Sean Hernandez presents for   New patient to establish care. 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 3 daughters - 71 - Counsellor - on asd, 15 at home - online with [employer] , [employer] at [place].  No tobacco. Alcohol - 4 per week.   Followed by GI for achalasia of esophagus.  Dr. Loletha Carrow. Planned POEM procedure  at [facility] in few weeks to loosen LES - Dr. Leroy Sea. 12/19/21.  Takes protonix 40mg  qd for heartburn - past few years.   Seasonal allergies - Zyrtec in spring, summer, fall.   Colonoscopy in 2021, Dr. Silverio Decamp. 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 Johney Maine. Planned repeat colonoscopy at 1 year - plans to schedule in next few months.  CBC normal prior to surgery, no recent CBC.   Not fasting - coffee with cream/sugar.   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  Tx: carmex.   HM: HIV/hep C screening - agrees to test.  Due for tetanus.  Covid vaccine - initial series. Declines booster. Had infection in 10/2020.possible recurrence, but not tested.  Shingles vaccine: defers today.     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   GERD (gastroesophageal reflux disease) 05/11/2020   Dysphagia 05/11/2020   Family history of colon cancer 05/11/2020   Past Medical History:  Diagnosis Date   Allergy    Arthritis    lower back   Asthma    Cancer  ([place])    Colon   Complication of anesthesia    GERD (gastroesophageal reflux disease)    Osteoid osteoma    PONV (postoperative nausea and vomiting)    Past Surgical History:  Procedure Laterality Date   BIOPSY  08/27/2020   Procedure: BIOPSY;  Surgeon: Irving Copas., MD;  Location: [facility] ENDOSCOPY;  Service: Gastroenterology;;   COLON RESECTION  10/24/2020   COLONOSCOPY  06/13/2020   COLONOSCOPY WITH PROPOFOL N/A 08/27/2020   Procedure: COLONOSCOPY WITH PROPOFOL;  Surgeon: Irving Copas., MD;  Location: [place];  Service: Gastroenterology;  Laterality: N/A;   DENTAL SURGERY     ESOPHAGEAL MANOMETRY N/A 08/31/2020   Procedure: ESOPHAGEAL MANOMETRY (EM);  Surgeon: Mauri Pole, MD;  Location: [facility] ENDOSCOPY;  Service: Endoscopy;  Laterality: N/A;   ESOPHAGEAL MANOMETRY N/A 08/07/2021   Procedure: ESOPHAGEAL MANOMETRY (EM);  Surgeon: Doran Stabler, MD;  Location: [facility] ENDOSCOPY;  Service: Gastroenterology;  Laterality: N/A;   NASAL RECONSTRUCTION     OSTEOTOMY FIBULA     osteoasteoma   SUBMUCOSAL LIFTING INJECTION  08/27/2020   Procedure: SUBMUCOSAL LIFTING INJECTION;  Surgeon: Rush Landmark Telford Nab., MD;  Location: [facility];  Service: Gastroenterology;;   SUBMUCOSAL TATTOO INJECTION  08/27/2020   Procedure: SUBMUCOSAL TATTOO INJECTION;  Surgeon: Irving Copas., MD;  Location: [facility];  Service: Gastroenterology;;   UPPER GI ENDOSCOPY  06/13/2020   WISDOM TOOTH EXTRACTION     Allergies  Allergen Reactions   Iodinated Contrast Media Other (See Comments)    Jittery, dizziness, palpitations   Prior to Admission medications   Medication Sig Start Date End Date Taking? Authorizing Provider  pantoprazole (PROTONIX) 40 MG tablet TAKE 1 TABLET (40 MG TOTAL) BY MOUTH DAILY. FURTHER REFILLS NEED TO COME FROM NEW PROVIDER 08/26/21  Yes Danis, Kirke Corin, MD  cetirizine (ZYRTEC) 10 MG tablet Take 10 mg by mouth daily. Patient not taking:  Reported on 11/29/2021    [provider]   Social History   Socioeconomic History   Marital status: Married    Spouse name: Not on file   Number of children: 3   Years of education: Not on file   Highest education level: Not on file  Occupational History   Not on file  Tobacco Use   Smoking status: Never   Smokeless tobacco: Never  Vaping Use   Vaping Use: Never used  Substance and Sexual Activity   Alcohol use: Yes    Alcohol/week: 5.0 standard drinks    Types: 5 Standard drinks or equivalent per week    Comment: beer/wine   Drug use: Never   Sexual activity: Yes  Other Topics Concern   Not on file  Social History Narrative   Not on file   Social Determinants of Health   Financial Resource Strain: Not on file  Food Insecurity: Not on file  Transportation Needs: Not on file  Physical Activity: Not on file  Stress: Not on file  Social Connections: Not on file  Intimate Partner Violence: Not on file    Review of Systems Per HPI   Objective:   Vitals:   11/29/21 0933  BP: 128/76  Pulse: (!) 57  Resp: 16  Temp: 98.1 F (36.7 C)  TempSrc: Temporal  SpO2: 100%  Weight: 173 lb 12.8 oz (78.8 kg)  Height: 5\' 7"  (1.702 m)     Physical Exam Vitals reviewed.  Constitutional:      Appearance: He is well-developed.  HENT:     Head: Normocephalic and atraumatic.  Neck:     Vascular: No carotid bruit or JVD.  Cardiovascular:     Rate and Rhythm: Normal rate and regular rhythm.     Heart sounds: Normal heart sounds. No murmur heard. Pulmonary:     Effort: Pulmonary effort is normal.     Breath sounds: Normal breath sounds. No rales.  Abdominal:     General: There is no distension.     Tenderness: There is no abdominal tenderness.  Musculoskeletal:     Right lower leg: No edema.     Left lower leg: No edema.  Skin:    General: Skin is warm and dry.  Neurological:     Mental Status: He is alert and oriented to person, place, and time.   Psychiatric:        Mood and Affect: Mood normal.       Assessment & Plan:  Sean Hernandez is a 51 y.o. male . Achalasia Heartburn  -Followed by gastroenterology with upcoming POEM procedure, continue same med regimen for now.  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  Screening for hyperlipidemia - Plan: Lipid panel  Screening, anemia, deficiency, iron - Plan: CBC  Screening for diabetes mellitus - Plan: Comprehensive metabolic panel  Cold sore - Plan: valACYclovir (VALTREX) 1000  MG tablet  -Option of Valtrex at onset of symptoms, 2 g with repeat dose in 12 hours months.  RTC precautions if persistent/recurrent and could consider daily suppression.  Need for diphtheria-tetanus-pertussis (Tdap) vaccine - Plan: Tdap vaccine greater than or equal to 7yo IM   Meds ordered this encounter  Medications   valACYclovir (VALTREX) 1000 MG tablet    Sig: Take 2 tablets (2,000 mg total) by mouth once for 1 dose. Repeat in 12 hours once. Take at first sign of flare.    Dispense:  12 tablet    Refill:  0   Patient Instructions  Good luck with upcoming procedure.  Fasting lab visit in next week.  If any concerns on your labs I will let you know. Valtrex 2 pills at the first sign of cold sore followed by 2 pills 12 hours later. Tetanus was updated today.  Let us know if you would like to have the shingles vaccine.  I can schedule as a nurse visit if needed.  Follow-up in 6 months for a physical but let me know if there are questions sooner. Thanks for coming in today.   Cold Sore A cold sore, also called a fever blister, is a small, fluid-filled sore that forms inside the mouth or on the lips, gums, nose, chin, or cheeks. Cold sores can spread to other parts of the body, such as the eyes or fingers. In some people who have other medical conditions, cold sores can spread to multiple other body sites, including the genitals. Cold sores can spread from person to  person (are contagious) until the sores crust over completely. Most cold sores go away within 2 weeks. What are the causes? Cold sores are caused by an infection from a common type of herpes simplex virus (HSV-1). HSV-1 is closely related to the HSV-2virus, which is the virus that causes genital herpes, but these viruses are not the same. Once a person is infected with HSV-1, the virus remains permanently in the body. HSV-1 is spread from person to person through close contact, such as through kissing, touching the affected area, or sharing personal items such as lip balm, razors, a drinking glass, or eating utensils. What increases the risk? You are more likely to develop this condition if you: Are tired, stressed, or sick. Are menstruating. Are pregnant. Take certain medicines. Are exposed to cold weather or too much sun. What are the signs or symptoms? Symptoms of a cold sore outbreak go through different stages. These are the stages of a cold sore: Tingling, itching, or burning is felt 1-2 days before the outbreak. Fluid-filled blisters appear on the lips, inside the mouth, on the nose, or on the cheeks. The blisters start to ooze clear fluid. The blisters dry up, and a yellow crust appears in their place. The crust falls off. In some cases, other symptoms can develop during a cold sore outbreak. These can include: Fever. Sore throat. Headache. Muscle aches. Swollen neck glands. How is this diagnosed? This condition is diagnosed based on your medical history and a physical exam. Your health care provider may do a blood test or may swab some fluid from your sore and then examine the swab in the lab. How is this treated? There is no cure for cold sores or HSV-1. There is also no vaccine for HSV-1. Most cold sores go away on their own without treatment within 2 weeks. Medicines cannot make the infection go away, but your health care provider may prescribe medicines  to: Help relieve some  of the pain associated with the sores. Work to stop the virus from multiplying. Shorten healing time. Medicines may be in the form of creams, gels, pills, or a shot. Follow these instructions at home: Medicines Take or apply over-the-counter and prescription medicines only as told by your health care provider. Use a cotton-tip swab to apply creams or gels to your sores. Ask your health care provider if you can take lysine supplements. Research has found that lysine may help heal the cold sore faster and prevent outbreaks. Sore care  Do not touch the sores or pick the scabs. Wash your hands often. Do not touch your eyes without washing your hands first. Keep the sores clean and dry. If directed, apply ice to the sores: Put ice in a plastic bag. Place a towel between your skin and the bag. Leave the ice on for 20 minutes, 2-3 times a day. Eating and drinking Eat a soft, bland diet. Avoid eating hot, cold, or salty foods. Use a straw if it hurts to drink out of a glass. Eat foods that are rich in lysine, such as meat, fish, and dairy products. Avoid sugary foods, chocolates, nuts, and grains. These foods are rich in a nutrient called arginine, which can cause the virus to multiply. Lifestyle Do not kiss, have oral sex, or share personal items until your sores heal. Stress, poor sleep, and being out in the sun can trigger outbreaks. Make sure you: Do activities that help you relax, such as deep breathing exercises or meditation. Get enough sleep. Apply sunscreen on your lips before you go out in the sun. Contact a health care provider if: You have symptoms for more than 2 weeks. You have pus coming from the sores. You have redness that is spreading. You have pain or irritation in your eye. You get sores on your genitals. Your sores do not heal within 2 weeks. You have frequent cold sore outbreaks. Get help right away if you have: A fever and your symptoms suddenly get worse. A  headache and confusion. Fatigue or loss of appetite. A stiff neck or sensitivity to light. Summary A cold sore, also called a fever blister, is a small, fluid-filled sore that forms inside the mouth or on the lips, gums, nose, chin, or cheeks. Most cold sores go away on their own without treatment within 2 weeks. Your health care provider may prescribe medicines to help relieve some of the pain, work to stop the virus from multiplying, and shorten healing time. Wash your hands often. Do not touch your eyes without washing your hands first. Do not kiss, have oral sex, or share personal items until your sores heal. Contact a health care provider if your sores do not heal within 2 weeks. This information is not intended to replace advice given to you by your health care provider. Make sure you discuss any questions you have with your health care provider. Document Revised: 05/02/2021 Document Reviewed: 03/29/2018 Elsevier Patient Education  2022 [place],   Merri Ray, MD [place] 11/29/21 10:00 AM

## 2021-11-29 NOTE — Patient Instructions (Addendum)
Good luck with upcoming procedure.  Fasting lab visit in next week.  If any concerns on your labs I will let you know. Valtrex 2 pills at the first sign of cold sore followed by 2 pills 12 hours later. Tetanus was updated today.  Let us know if you would like to have the shingles vaccine.  I can schedule as a nurse visit if needed.  Follow-up in 6 months for a physical but let me know if there are questions sooner. Thanks for coming in today.   Cold Sore A cold sore, also called a fever blister, is a small, fluid-filled sore that forms inside the mouth or on the lips, gums, nose, chin, or cheeks. Cold sores can spread to other parts of the body, such as the eyes or fingers. In some people who have other medical conditions, cold sores can spread to multiple other body sites, including the genitals. Cold sores can spread from person to person (are contagious) until the sores crust over completely. Most cold sores go away within 2 weeks. What are the causes? Cold sores are caused by an infection from a common type of herpes simplex virus (HSV-1). HSV-1 is closely related to the HSV-2virus, which is the virus that causes genital herpes, but these viruses are not the same. Once a person is infected with HSV-1, the virus remains permanently in the body. HSV-1 is spread from person to person through close contact, such as through kissing, touching the affected area, or sharing personal items such as lip balm, razors, a drinking glass, or eating utensils. What increases the risk? You are more likely to develop this condition if you: Are tired, stressed, or sick. Are menstruating. Are pregnant. Take certain medicines. Are exposed to cold weather or too much sun. What are the signs or symptoms? Symptoms of a cold sore outbreak go through different stages. These are the stages of a cold sore: Tingling, itching, or burning is felt 1-2 days before the outbreak. Fluid-filled blisters appear on the lips,  inside the mouth, on the nose, or on the cheeks. The blisters start to ooze clear fluid. The blisters dry up, and a yellow crust appears in their place. The crust falls off. In some cases, other symptoms can develop during a cold sore outbreak. These can include: Fever. Sore throat. Headache. Muscle aches. Swollen neck glands. How is this diagnosed? This condition is diagnosed based on your medical history and a physical exam. Your health care provider may do a blood test or may swab some fluid from your sore and then examine the swab in the lab. How is this treated? There is no cure for cold sores or HSV-1. There is also no vaccine for HSV-1. Most cold sores go away on their own without treatment within 2 weeks. Medicines cannot make the infection go away, but your health care provider may prescribe medicines to: Help relieve some of the pain associated with the sores. Work to stop the virus from multiplying. Shorten healing time. Medicines may be in the form of creams, gels, pills, or a shot. Follow these instructions at home: Medicines Take or apply over-the-counter and prescription medicines only as told by your health care provider. Use a cotton-tip swab to apply creams or gels to your sores. Ask your health care provider if you can take lysine supplements. Research has found that lysine may help heal the cold sore faster and prevent outbreaks. Sore care  Do not touch the sores or pick the scabs. Wash  your hands often. Do not touch your eyes without washing your hands first. Keep the sores clean and dry. If directed, apply ice to the sores: Put ice in a plastic bag. Place a towel between your skin and the bag. Leave the ice on for 20 minutes, 2-3 times a day. Eating and drinking Eat a soft, bland diet. Avoid eating hot, cold, or salty foods. Use a straw if it hurts to drink out of a glass. Eat foods that are rich in lysine, such as meat, fish, and dairy products. Avoid sugary  foods, chocolates, nuts, and grains. These foods are rich in a nutrient called arginine, which can cause the virus to multiply. Lifestyle Do not kiss, have oral sex, or share personal items until your sores heal. Stress, poor sleep, and being out in the sun can trigger outbreaks. Make sure you: Do activities that help you relax, such as deep breathing exercises or meditation. Get enough sleep. Apply sunscreen on your lips before you go out in the sun. Contact a health care provider if: You have symptoms for more than 2 weeks. You have pus coming from the sores. You have redness that is spreading. You have pain or irritation in your eye. You get sores on your genitals. Your sores do not heal within 2 weeks. You have frequent cold sore outbreaks. Get help right away if you have: A fever and your symptoms suddenly get worse. A headache and confusion. Fatigue or loss of appetite. A stiff neck or sensitivity to light. Summary A cold sore, also called a fever blister, is a small, fluid-filled sore that forms inside the mouth or on the lips, gums, nose, chin, or cheeks. Most cold sores go away on their own without treatment within 2 weeks. Your health care provider may prescribe medicines to help relieve some of the pain, work to stop the virus from multiplying, and shorten healing time. Wash your hands often. Do not touch your eyes without washing your hands first. Do not kiss, have oral sex, or share personal items until your sores heal. Contact a health care provider if your sores do not heal within 2 weeks. This information is not intended to replace advice given to you by your health care provider. Make sure you discuss any questions you have with your health care provider. Document Revised: 05/02/2021 Document Reviewed: 03/29/2018 Elsevier Patient Education  2022 [employer].

## 2021-12-03 ENCOUNTER — Other Ambulatory Visit: Payer: 59

## 2021-12-04 ENCOUNTER — Other Ambulatory Visit (HOSPITAL_COMMUNITY): Payer: Self-pay

## 2021-12-06 ENCOUNTER — Other Ambulatory Visit: Payer: 59

## 2021-12-11 ENCOUNTER — Other Ambulatory Visit (INDEPENDENT_AMBULATORY_CARE_PROVIDER_SITE_OTHER): Payer: 59

## 2021-12-11 DIAGNOSIS — Z131 Encounter for screening for diabetes mellitus: Secondary | ICD-10-CM | POA: Diagnosis not present

## 2021-12-11 DIAGNOSIS — Z13220 Encounter for screening for lipoid disorders: Secondary | ICD-10-CM | POA: Diagnosis not present

## 2021-12-11 DIAGNOSIS — Z130 Encounter for screening for diseases of the blood and blood-forming organs and certain disorders involving the immune mechanism: Secondary | ICD-10-CM

## 2021-12-11 HISTORY — PX: OTHER SURGICAL HISTORY: SHX169

## 2021-12-11 LAB — CBC
HCT: 42.4 % (ref 39.0–52.0)
Hemoglobin: 14.3 g/dL (ref 13.0–17.0)
MCHC: 33.8 g/dL (ref 30.0–36.0)
MCV: 91.1 fl (ref 78.0–100.0)
Platelets: 249 10*3/uL (ref 150.0–400.0)
RBC: 4.66 Mil/uL (ref 4.22–5.81)
RDW: 13.6 % (ref 11.5–15.5)
WBC: 5.5 10*3/uL (ref 4.0–10.5)

## 2021-12-11 LAB — COMPREHENSIVE METABOLIC PANEL
ALT: 15 U/L (ref 0–53)
AST: 18 U/L (ref 0–37)
Albumin: 4.3 g/dL (ref 3.5–5.2)
Alkaline Phosphatase: 50 U/L (ref 39–117)
BUN: 14 mg/dL (ref 6–23)
CO2: 30 mEq/L (ref 19–32)
Calcium: 9.4 mg/dL (ref 8.4–10.5)
Chloride: 104 mEq/L (ref 96–112)
Creatinine, Ser: 1.01 mg/dL (ref 0.40–1.50)
GFR: 85.73 mL/min (ref >60.00)
Glucose, Bld: 89 mg/dL (ref 70–99)
Potassium: 4.6 mEq/L (ref 3.5–5.1)
Sodium: 138 mEq/L (ref 135–145)
Total Bilirubin: 0.9 mg/dL (ref 0.2–1.2)
Total Protein: 6.9 g/dL (ref 6.0–8.3)

## 2021-12-11 LAB — LIPID PANEL
Cholesterol: 178 mg/dL (ref 0–200)
HDL: 62.7 mg/dL (ref >39.00)
LDL Cholesterol: 95 mg/dL (ref 0–99)
NonHDL: 115.39
Total CHOL/HDL Ratio: 3
Triglycerides: 102 mg/dL (ref 0.0–149.0)
VLDL: 20.4 mg/dL (ref 0.0–40.0)

## 2021-12-23 ENCOUNTER — Telehealth: Payer: Self-pay

## 2021-12-23 NOTE — Telephone Encounter (Signed)
Called patient - he was able to have BM since earlier call.  Sore at anal area from passing hard stool.. Mashed potatoes, soft foods currently, but is on pain meds after procedure, drinking fluids. Likley can try miralax or at minimum colace, but advised to contact gastroenterology in am to discuss specific recommendations as recent GI procedure and modified diet. Understanding expressed.

## 2021-12-23 NOTE — Telephone Encounter (Signed)
Caller name:[facility]  On DPR? :Yes  Call back number:[phone]  Provider they see: Carlota Raspberry  Reason for call: Pt is having sever constipation since this morning several days days pt had procedure done that required and pt thought it was normal and it has been three days and still can not go.

## 2021-12-23 NOTE — Telephone Encounter (Signed)
Pt is experiencing some constipation, should he treat with OTC Miralax(or like)? Or should he have an appointment to be seen?

## 2021-12-25 ENCOUNTER — Encounter: Payer: Self-pay | Admitting: Gastroenterology

## 2022-01-16 ENCOUNTER — Encounter: Payer: Self-pay | Admitting: Gastroenterology

## 2022-03-25 ENCOUNTER — Encounter: Payer: Self-pay | Admitting: Gastroenterology

## 2022-04-10 ENCOUNTER — Ambulatory Visit (AMBULATORY_SURGERY_CENTER): Payer: Self-pay | Admitting: *Deleted

## 2022-04-10 VITALS — Ht 67.0 in | Wt 176.0 lb

## 2022-04-10 DIAGNOSIS — Z85038 Personal history of other malignant neoplasm of large intestine: Secondary | ICD-10-CM

## 2022-04-10 MED ORDER — NA SULFATE-K SULFATE-MG SULF 17.5-3.13-1.6 GM/177ML PO SOLN: 1.0000 | Freq: Once | ORAL | 0 refills | Status: AC

## 2022-04-10 NOTE — Progress Notes (Signed)
No egg or soy allergy known to patient  issues known to pt with past sedation with any surgeries or procedures of PONV  Patient denies ever being told they had issues or difficulty with intubation  No FH of Malignant Hyperthermia Pt is not on diet pills Pt is not on  home 02  Pt is not on blood thinners  Pt denies issues with constipation  No A fib or A flutter   NO PA's for preps discussed with pt In PV today  Discussed with pt there will be an out-of-pocket cost for prep and that varies from $0 to 70 +  dollars - pt verbalized understanding  Pt instructed to use Singlecare.com or GoodRx for a price reduction on prep   PV completed over the phone. Pt verified name, DOB, address and insurance during PV today.  Pt mailed instruction packet with copy of consent form to read and not return, and instructions.  Pt encouraged to call with questions or issues.  If pt has My chart, procedure instructions sent via My Chart  Insurance confirmed with pt at [facility] today

## 2022-04-27 ENCOUNTER — Encounter: Payer: Self-pay | Admitting: Certified Registered Nurse Anesthetist

## 2022-04-28 ENCOUNTER — Encounter: Payer: Self-pay | Admitting: Gastroenterology

## 2022-04-30 ENCOUNTER — Ambulatory Visit (AMBULATORY_SURGERY_CENTER): Payer: 59 | Admitting: Gastroenterology

## 2022-04-30 ENCOUNTER — Encounter: Payer: Self-pay | Admitting: Gastroenterology

## 2022-04-30 VITALS — BP 124/78 | HR 53 | Temp 96.8°F | Resp 15 | Ht 67.0 in | Wt 176.0 lb

## 2022-04-30 DIAGNOSIS — Z85038 Personal history of other malignant neoplasm of large intestine: Secondary | ICD-10-CM

## 2022-04-30 DIAGNOSIS — Z08 Encounter for follow-up examination after completed treatment for malignant neoplasm: Secondary | ICD-10-CM

## 2022-04-30 MED ORDER — SODIUM CHLORIDE 0.9 % IV SOLN: 500.0000 mL | Freq: Once | INTRAVENOUS | Status: DC

## 2022-04-30 NOTE — Patient Instructions (Signed)
YOU HAD AN ENDOSCOPIC PROCEDURE TODAY AT [facility]:   Refer to the procedure report that was given to you for any specific questions about what was found during the examination.  If the procedure report does not answer your questions, please call your gastroenterologist to clarify.  If you requested that your care partner not be given the details of your procedure findings, then the procedure report has been included in a sealed envelope for you to review at your convenience later.  YOU SHOULD EXPECT: Some feelings of bloating in the abdomen. Passage of more gas than usual.  Walking can help get rid of the air that was put into your GI tract during the procedure and reduce the bloating. If you had a lower endoscopy (such as a colonoscopy or flexible sigmoidoscopy) you may notice spotting of blood in your stool or on the toilet paper. If you underwent a bowel prep for your procedure, you may not have a normal bowel movement for a few days.  Please Note:  You might notice some irritation and congestion in your nose or some drainage.  This is from the oxygen used during your procedure.  There is no need for concern and it should clear up in a day or so.  SYMPTOMS TO REPORT IMMEDIATELY:  Following lower endoscopy (colonoscopy or flexible sigmoidoscopy):  Excessive amounts of blood in the stool  Significant tenderness or worsening of abdominal pains  Swelling of the abdomen that is new, acute  Fever of 100F or higher  Following upper endoscopy (EGD)  Vomiting of blood or coffee ground material  New chest pain or pain under the shoulder blades  Painful or persistently difficult swallowing  New shortness of breath  Fever of 100F or higher  Black, tarry-looking stools  For urgent or emergent issues, a gastroenterologist can be reached at any hour by calling [phone]. Do not use MyChart messaging for urgent concerns.    DIET:  We do recommend a small meal at first, but  then you may proceed to your regular diet.  Drink plenty of fluids but you should avoid alcoholic beverages for 24 hours.  ACTIVITY:  You should plan to take it easy for the rest of today and you should NOT DRIVE or use heavy machinery until tomorrow (because of the sedation medicines used during the test).    FOLLOW UP: Our staff will call the number listed on your records 24-72 hours following your procedure to check on you and address any questions or concerns that you may have regarding the information given to you following your procedure. If we do not reach you, we will leave a message.  We will attempt to reach you two times.  During this call, we will ask if you have developed any symptoms of COVID 19. If you develop any symptoms (ie: fever, flu-like symptoms, shortness of breath, cough etc.) before then, please call [phone].  If you test positive for Covid 19 in the 2 weeks post procedure, please call and report this information to [place].    If any biopsies were taken you will be contacted by phone or by letter within the next 1-3 weeks.  Please call us at [phone] if you have not heard about the biopsies in 3 weeks.    SIGNATURES/CONFIDENTIALITY: You and/or your care partner have signed paperwork which will be entered into your electronic medical record.  These signatures attest to the fact that that the information above on your After  Visit Summary has been reviewed and is understood.  Full responsibility of the confidentiality of this discharge information lies with you and/or your care-partner.

## 2022-04-30 NOTE — Progress Notes (Signed)
History and Physical:  This patient presents for endoscopic testing for: Encounter Diagnosis  Name Primary?   Personal history of colon cancer Yes    Sean Hernandez is here for colon cancer surveillance.  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  This is his first surveillance colonoscopy.  In the interim, he also underwent a POEM procedure for achalasia with Dr. Leroy Sea at [facility]. Reports bowel habits are regular without rectal bleeding  He also indicates that his dysphagia is markedly improved after the POEM, and he plans to schedule follow-up with his GI physician.  Patient is otherwise without complaints or active issues today.   Past Medical History: Past Medical History:  Diagnosis Date   Allergy    Arthritis    lower back   Asthma    Cancer ([place]) [id number]   Colon   Complication of anesthesia    GERD (gastroesophageal reflux disease)    Osteoid osteoma    PONV (postoperative nausea and vomiting)      Past Surgical History: Past Surgical History:  Procedure Laterality Date   BIOPSY  08/27/2020   Procedure: BIOPSY;  Surgeon: Irving Copas., MD;  Location: [place];  Service: Gastroenterology;;   COLON RESECTION  10/24/2020   COLON SURGERY     colectomy   COLONOSCOPY  06/13/2020   COLONOSCOPY WITH PROPOFOL N/A 08/27/2020   Procedure: COLONOSCOPY WITH PROPOFOL;  Surgeon: Irving Copas., MD;  Location: [place];  Service: Gastroenterology;  Laterality: N/A;   DENTAL SURGERY     ESOPHAGEAL MANOMETRY N/A 08/31/2020   Procedure: ESOPHAGEAL MANOMETRY (EM);  Surgeon: Mauri Pole, MD;  Location: [facility];  Service: Endoscopy;  Laterality: N/A;   ESOPHAGEAL MANOMETRY N/A 08/07/2021   Procedure: ESOPHAGEAL MANOMETRY (EM);  Surgeon: Doran Stabler, MD;  Location: [facility];  Service: Gastroenterology;  Laterality: N/A;   NASAL RECONSTRUCTION     OSTEOTOMY FIBULA     osteoasteoma   POEM  12/2021   [facility]   SUBMUCOSAL  LIFTING INJECTION  08/27/2020   Procedure: SUBMUCOSAL LIFTING INJECTION;  Surgeon: Irving Copas., MD;  Location: [place];  Service: Gastroenterology;;   SUBMUCOSAL TATTOO INJECTION  08/27/2020   Procedure: SUBMUCOSAL TATTOO INJECTION;  Surgeon: Irving Copas., MD;  Location: [place];  Service: Gastroenterology;;   UPPER GASTROINTESTINAL ENDOSCOPY     UPPER GI ENDOSCOPY  06/13/2020   WISDOM TOOTH EXTRACTION      Allergies: Allergies  Allergen Reactions   Iodinated Contrast Media Other (See Comments)    Jittery, dizziness, palpitations    Outpatient Meds: Current Outpatient Medications  Medication Sig Dispense Refill   cetirizine (ZYRTEC) 10 MG tablet Take 10 mg by mouth daily.     pantoprazole (PROTONIX) 40 MG tablet TAKE 1 TABLET (40 MG TOTAL) BY MOUTH DAILY. FURTHER REFILLS NEED TO COME FROM NEW PROVIDER (Patient not taking: Reported on 04/10/2022) 90 tablet 0   valACYclovir (VALTREX) 1000 MG tablet Take by mouth.     Current Facility-Administered Medications  Medication Dose Route Frequency Provider Last Rate Last Admin   0.9 %  sodium chloride infusion  500 mL Intravenous Once Nelida Meuse III, MD          ___________________________________________________________________ Objective   Exam:  BP 114/61   Pulse 65   Temp (!) 96.8 F (36 C) (Skin)   Ht '5\' 7"'$  (1.702 m)   Wt 176 lb (79.8 kg)   SpO2 100%   BMI 27.57 kg/m   CV:  RRR without murmur, S1/S2 Resp: clear to auscultation bilaterally, normal RR and effort noted GI: soft, no tenderness, with active bowel sounds.   Assessment: Encounter Diagnosis  Name Primary?   Personal history of colon cancer Yes     Plan: Colonoscopy  The benefits and risks of the planned procedure were described in detail with the patient or (when appropriate) their health care proxy.  Risks were outlined as including, but not limited to, bleeding, infection, perforation, adverse medication reaction  leading to cardiac or pulmonary decompensation, pancreatitis (if ERCP).  The limitation of incomplete mucosal visualization was also discussed.  No guarantees or warranties were given.    The patient is appropriate for an endoscopic procedure in the ambulatory setting.   - Wilfrid Lund, MD

## 2022-04-30 NOTE — Progress Notes (Signed)
Report given to PACU, vss 

## 2022-04-30 NOTE — Op Note (Signed)
[facility] Patient Name: Sean Hernandez Procedure Date: 04/30/2022 10:39 AM MRN: [id number] Endoscopist: Mallie Mussel L. 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 The patient's tolerance of                            previous anesthesia was also reviewed. The risks                            and benefits of the procedure and the sedation                            options and risks were discussed with the patient.                            All questions were answered, and informed consent                            was obtained. Prior Anticoagulants: The patient has                            taken no previous anticoagulant or antiplatelet                            agents. ASA Grade Assessment: II - A patient with                            mild systemic disease. After reviewing the risks                            and benefits, the patient was deemed in  satisfactory condition to undergo the procedure.                           After obtaining informed consent, the colonoscope                            was passed under direct vision. Throughout the                            procedure, the patient's blood pressure, pulse, and                            oxygen saturations were monitored continuously. The                            CF [id number] #[id number] was introduced through the  anus                            and advanced to the the ileocolonic anastomosis and                            neo-terminal ileum. The colonoscopy was performed                            without difficulty. The patient tolerated the                            procedure well. The quality of the bowel                            preparation was excellent. The neo-terminal ileum,                            the rectum and ileo-colonic anastomosis were                            photographed. Scope In: 10:54:27 AM Scope Out: 11:05:02 AM Scope Withdrawal Time: 0 hours 8 minutes 43 seconds  Total Procedure Duration: 0 hours 10 minutes 35 seconds  Findings:                 The perianal and digital rectal examinations were                            normal.                           There was evidence of a prior side-to-side                            ileo-colonic anastomosis in the proximal transverse                            colon. This was patent and was characterized by  healthy appearing mucosa.                           The exam was otherwise without abnormality on                            direct and retroflexion views. Complications:            No immediate complications. Estimated Blood Loss:     Estimated blood loss: none. Impression:               - Patent side-to-side ileo-colonic anastomosis,                            characterized by healthy appearing mucosa.                           - The examination was otherwise normal on direct                            and retroflexion views.                           - No specimens collected. Recommendation:           - Patient has a contact number available for                            emergencies. The signs and symptoms of potential                            delayed complications were discussed with the                            patient. Return to normal activities tomorrow.                             Written discharge instructions were provided to the                            patient.                           - Resume previous diet.                           - Continue present medications.                           - Repeat colonoscopy in 3 years for surveillance. Shaheim Mahar L. Loletha Carrow, MD 04/30/2022 11:09:59 AM This report has been signed electronically.

## 2022-04-30 NOTE — Progress Notes (Signed)
VS by CW  Pt's states no medical or surgical changes since previsit or office visit.  

## 2022-05-01 ENCOUNTER — Telehealth: Payer: Self-pay

## 2022-05-01 NOTE — Telephone Encounter (Signed)
No answer, left message to call if having any issues or concerns, B.Shandi Godfrey RN 

## 2022-06-12 ENCOUNTER — Ambulatory Visit (INDEPENDENT_AMBULATORY_CARE_PROVIDER_SITE_OTHER): Payer: 59 | Admitting: Family Medicine

## 2022-06-12 ENCOUNTER — Encounter: Payer: Self-pay | Admitting: Family Medicine

## 2022-06-12 VITALS — BP 128/74 | HR 72 | Temp 98.2°F | Resp 16 | Ht 67.0 in | Wt 180.8 lb

## 2022-06-12 DIAGNOSIS — Z125 Encounter for screening for malignant neoplasm of prostate: Secondary | ICD-10-CM

## 2022-06-12 DIAGNOSIS — Z0000 Encounter for general adult medical examination without abnormal findings: Secondary | ICD-10-CM | POA: Diagnosis not present

## 2022-06-12 LAB — PSA: PSA: 1.25 ng/mL (ref 0.10–4.00)

## 2022-06-12 NOTE — Progress Notes (Signed)
Subjective:  Patient ID: Sean Hernandez, male    DOB: Mar 15, 1971  Age: 51 y.o. MRN: [id number]  CC:  Chief Complaint  Patient presents with   Annual Exam    Pt is fasting     HPI Sean Hernandez presents for Annual Exam  Gastroenterology, followed by Dr. Loletha Carrow and Dr. Leroy Sea.  History of achalasia, underwent POEM procedure with Dr. Leroy Sea.  Colonoscopy with Dr. Loletha Carrow on June 21.  Side to side ileocolonic anastomosis patent.  Repeat 3 years. treated with Protonix 40 mg QD for heartburn prior - off past few months - doing better.  No health changes otherwise.       06/12/2022    8:11 AM 11/29/2021    9:37 AM 03/23/2020    8:44 AM  Depression screen PHQ 2/9  Decreased Interest 0 0 0  Down, Depressed, Hopeless 0 0 0  PHQ - 2 Score 0 0 0  Altered sleeping  0 0  Tired, decreased energy  1 0  Change in appetite  0 0  Feeling bad or failure about yourself   0 0  Trouble concentrating  0 0  Moving slowly or fidgety/restless  0 0  Suicidal thoughts  0 0  PHQ-9 Score  1 0    Health Maintenance  Topic Date Due   COVID-19 Vaccine (2 - Pfizer risk series) 06/28/2022 (Originally 04/06/2020)   Zoster Vaccines- Shingrix (1 of 2) 09/12/2022 (Originally 10/20/1988)   INFLUENZA VACCINE  02/08/2023 (Originally 06/10/2022)   Hepatitis C Screening  06/13/2023 (Originally 10/21/1987)   HIV Screening  06/13/2023 (Originally 10/20/1984)   COLONOSCOPY (Pts 45-38yr Insurance coverage will need to be confirmed)  04/30/2025   TETANUS/TDAP  11/30/2031   HPV VACCINES  Aged Out  Colonoscopy recently as above Prostate: does not have family history of prostate cancer The natural history of prostate cancer and ongoing controversy regarding screening and potential treatment outcomes of prostate cancer has been discussed with the patient. The meaning of a false positive PSA and a false negative PSA has been discussed. He indicates understanding of the limitations of this screening test and wishes to proceed  with screening PSA testing. Lab Results  Component Value Date   PSA 1.08 03/23/2020    Immunization History  Administered Date(s) Administered   Influenza-Unspecified 10/23/2021   PFIZER(Purple Top)SARS-COV-2 Vaccination 03/16/2020   Tdap 11/29/2021  Shingles vaccine deferred last visit, declined at this time.  COVID bivalent booster recommended. No recent infection.   No results found. Optho eval about a year ago. Due.   Dental: every 6 months.  Alcohol: 4 per week.   Tobacco: none  Exercise: weights and core 2-3 times per week, 45 min.  Regular diet, with some fruits, vegetables. Rare fast food. Some diet soda.  Coffee with creamer today.  Normal CMP, lipid panel, CBC in 12/2021.  Some soreness in shoulders at times past 6 months with overhead lifting. Cut out pushups. Feeling better with change in routine. Tx: none. Plans to follow up if not continuing to improve.    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   GERD (gastroesophageal reflux disease) 05/11/2020   Dysphagia 05/11/2020   Family history of colon cancer 05/11/2020   Past Medical History:  Diagnosis Date  Allergy    Arthritis    lower back   Asthma    Cancer ([place]) 8657   Colon   Complication of anesthesia    GERD (gastroesophageal reflux disease)    Osteoid osteoma    PONV (postoperative nausea and vomiting)    Past Surgical History:  Procedure Laterality Date   BIOPSY  08/27/2020   Procedure: BIOPSY;  Surgeon: Irving Copas., MD;  Location: [facility];  Service: Gastroenterology;;   COLON RESECTION  10/24/2020   COLON SURGERY     colectomy   COLONOSCOPY  06/13/2020   COLONOSCOPY WITH PROPOFOL N/A 08/27/2020   Procedure: COLONOSCOPY WITH  PROPOFOL;  Surgeon: Irving Copas., MD;  Location: [facility];  Service: Gastroenterology;  Laterality: N/A;   DENTAL SURGERY     ESOPHAGEAL MANOMETRY N/A 08/31/2020   Procedure: ESOPHAGEAL MANOMETRY (EM);  Surgeon: Mauri Pole, MD;  Location: [facility];  Service: Endoscopy;  Laterality: N/A;   ESOPHAGEAL MANOMETRY N/A 08/07/2021   Procedure: ESOPHAGEAL MANOMETRY (EM);  Surgeon: Doran Stabler, MD;  Location: [facility];  Service: Gastroenterology;  Laterality: N/A;   NASAL RECONSTRUCTION     OSTEOTOMY FIBULA     osteoasteoma   POEM  12/2021   [facility]   SUBMUCOSAL LIFTING INJECTION  08/27/2020   Procedure: SUBMUCOSAL LIFTING INJECTION;  Surgeon: Irving Copas., MD;  Location: [facility];  Service: Gastroenterology;;   SUBMUCOSAL TATTOO INJECTION  08/27/2020   Procedure: SUBMUCOSAL TATTOO INJECTION;  Surgeon: Irving Copas., MD;  Location: [facility];  Service: Gastroenterology;;   UPPER GASTROINTESTINAL ENDOSCOPY     UPPER GI ENDOSCOPY  06/13/2020   WISDOM TOOTH EXTRACTION     Allergies  Allergen Reactions   Iodinated Contrast Media Other (See Comments)    Jittery, dizziness, palpitations   Prior to Admission medications   Medication Sig Start Date End Date Taking? Authorizing Provider  cetirizine (ZYRTEC) 10 MG tablet Take 10 mg by mouth daily.   Yes [provider]  valACYclovir (VALTREX) 1000 MG tablet Take by mouth. 11/29/21  Yes [provider]  pantoprazole (PROTONIX) 40 MG tablet TAKE 1 TABLET (40 MG TOTAL) BY MOUTH DAILY. FURTHER REFILLS NEED TO COME FROM NEW PROVIDER Patient not taking: Reported on 04/10/2022 08/26/21   Doran Stabler, MD   Social History   Socioeconomic History   Marital status: Married    Spouse name: Not on file   Number of children: 3   Years of education: Not on file   Highest education level: Not on file  Occupational History   Not on file  Tobacco Use   Smoking status: Never    Smokeless tobacco: Never  Vaping Use   Vaping Use: Never used  Substance and Sexual Activity   Alcohol use: Yes    Alcohol/week: 5.0 standard drinks of alcohol    Types: 5 Standard drinks or equivalent per week    Comment: beer/wine   Drug use: Never   Sexual activity: Yes  Other Topics Concern   Not on file  Social History Narrative   Not on file   Social Determinants of Health   Financial Resource Strain: Not on file  Food Insecurity: Not on file  Transportation Needs: Not on file  Physical Activity: Not on file  Stress: Not on file  Social Connections: Not on file  Intimate Partner Violence: Not on file    Review of Systems 13 point review of systems per patient health survey noted.  Negative  other than as indicated above or in HPI.    Objective:   Vitals:   06/12/22 0807  BP: 128/74  Pulse: 72  Resp: 16  Temp: 98.2 F (36.8 C)  TempSrc: Oral  SpO2: 98%  Weight: 180 lb 12.8 oz (82 kg)  Height: '5\' 7"'$  (1.702 m)     Physical Exam Vitals reviewed.  Constitutional:      Appearance: He is well-developed.  HENT:     Head: Normocephalic and atraumatic.     Right Ear: External ear normal.     Left Ear: External ear normal.  Eyes:     Conjunctiva/sclera: Conjunctivae normal.     Pupils: Pupils are equal, round, and reactive to light.  Neck:     Thyroid: No thyromegaly.  Cardiovascular:     Rate and Rhythm: Normal rate and regular rhythm.     Heart sounds: Normal heart sounds.  Pulmonary:     Effort: Pulmonary effort is normal. No respiratory distress.     Breath sounds: Normal breath sounds. No wheezing.  Abdominal:     General: There is no distension.     Palpations: Abdomen is soft.     Tenderness: There is no abdominal tenderness.  Musculoskeletal:        General: No tenderness. Normal range of motion.     Cervical back: Normal range of motion and neck supple.     Comments: Bilateral shoulders, no focal tenderness, full range of motion, negative  empty can, [place], [place].  Full rotator cuff strength.  Neck, no midline bony tenderness, pain-free range of motion, no paraspinal tenderness.    Lymphadenopathy:     Cervical: No cervical adenopathy.  Skin:    General: Skin is warm and dry.  Neurological:     Mental Status: He is alert and oriented to person, place, and time.     Deep Tendon Reflexes: Reflexes are normal and symmetric.  Psychiatric:        Behavior: Behavior normal.     Assessment & Plan:  Sean Hernandez is a 51 y.o. male . Annual physical exam - Plan: PSA  - -anticipatory guidance as below in AVS, screening labs above. Health maintenance items as above in HPI discussed/recommended as applicable.   -RTC precautions if shoulder pain does not continue to improve with activity modification.  -Reassuring labs in February, hold on screening labs at this time other than PSA as below.  Screening for prostate cancer - Plan: PSA   No orders of the defined types were placed in this encounter.  Patient Instructions  Thanks for coming in today.  I will let you know if there are any concerns on the PSA blood test.  Can check other screening fasting labs at your next physical as those looked okay last visit.  Follow-up if shoulder pain does not continue to improve with adjustments in exercise.  Preventive Care 57-53 Years Old, Male Preventive care refers to lifestyle choices and visits with your health care provider that can promote health and wellness. Preventive care visits are also called wellness exams. What can I expect for my preventive care visit? Counseling During your preventive care visit, your health care provider may ask about your: Medical history, including: Past medical problems. Family medical history. Current health, including: Emotional well-being. Home life and relationship well-being. Sexual activity. Lifestyle, including: Alcohol, nicotine or tobacco, and drug use. Access to firearms. Diet,  exercise, and sleep habits. Safety issues such as seatbelt and bike helmet use. Sunscreen use.  Work and work Statistician. Physical exam Your health care provider will check your: Height and weight. These may be used to calculate your BMI (body mass index). BMI is a measurement that tells if you are at a healthy weight. Waist circumference. This measures the distance around your waistline. This measurement also tells if you are at a healthy weight and may help predict your risk of certain diseases, such as type 2 diabetes and high blood pressure. Heart rate and blood pressure. Body temperature. Skin for abnormal spots. What immunizations do I need?  Vaccines are usually given at various ages, according to a schedule. Your health care provider will recommend vaccines for you based on your age, medical history, and lifestyle or other factors, such as travel or where you work. What tests do I need? Screening Your health care provider may recommend screening tests for certain conditions. This may include: Lipid and cholesterol levels. Diabetes screening. This is done by checking your blood sugar (glucose) after you have not eaten for a while (fasting). Hepatitis B test. Hepatitis C test. HIV (human immunodeficiency virus) test. STI (sexually transmitted infection) testing, if you are at risk. Lung cancer screening. Prostate cancer screening. Colorectal cancer screening. Talk with your health care provider about your test results, treatment options, and if necessary, the need for more tests. Follow these instructions at home: Eating and drinking  Eat a diet that includes fresh fruits and vegetables, whole grains, lean protein, and low-fat dairy products. Take vitamin and mineral supplements as recommended by your health care provider. Do not drink alcohol if your health care provider tells you not to drink. If you drink alcohol: Limit how much you have to 0-2 drinks a day. Know how much  alcohol is in your drink. In the [place] one drink equals one 12 oz bottle of beer (355 mL), one 5 oz glass of wine (148 mL), or one 1 oz glass of hard liquor (44 mL). Lifestyle Brush your teeth every morning and night with fluoride toothpaste. Floss one time each day. Exercise for at least 30 minutes 5 or more days each week. Do not use any products that contain nicotine or tobacco. These products include cigarettes, chewing tobacco, and vaping devices, such as e-cigarettes. If you need help quitting, ask your health care provider. Do not use drugs. If you are sexually active, practice safe sex. Use a condom or other form of protection to prevent STIs. Take aspirin only as told by your health care provider. Make sure that you understand how much to take and what form to take. Work with your health care provider to find out whether it is safe and beneficial for you to take aspirin daily. Find healthy ways to manage stress, such as: Meditation, yoga, or listening to music. Journaling. Talking to a trusted person. Spending time with friends and family. Minimize exposure to UV radiation to reduce your risk of skin cancer. Safety Always wear your seat belt while driving or riding in a vehicle. Do not drive: If you have been drinking alcohol. Do not ride with someone who has been drinking. When you are tired or distracted. While texting. If you have been using any mind-altering substances or drugs. Wear a helmet and other protective equipment during sports activities. If you have firearms in your house, make sure you follow all gun safety procedures. What's next? Go to your health care provider once a year for an annual wellness visit. Ask your health care provider how often you should have  your eyes and teeth checked. Stay up to date on all vaccines. This information is not intended to replace advice given to you by your health care provider. Make sure you discuss any questions you have with  your health care provider. Document Revised: 04/24/2021 Document Reviewed: 04/24/2021 Elsevier Patient Education  [place],   Merri Ray, MD [place] 06/12/22 8:44 AM

## 2022-06-12 NOTE — Patient Instructions (Signed)
Thanks for coming in today.  I will let you know if there are any concerns on the PSA blood test.  Can check other screening fasting labs at your next physical as those looked okay last visit.  Follow-up if shoulder pain does not continue to improve with adjustments in exercise.  Preventive Care 76-53 Years Old, Male Preventive care refers to lifestyle choices and visits with your health care provider that can promote health and wellness. Preventive care visits are also called wellness exams. What can I expect for my preventive care visit? Counseling During your preventive care visit, your health care provider may ask about your: Medical history, including: Past medical problems. Family medical history. Current health, including: Emotional well-being. Home life and relationship well-being. Sexual activity. Lifestyle, including: Alcohol, nicotine or tobacco, and drug use. Access to firearms. Diet, exercise, and sleep habits. Safety issues such as seatbelt and bike helmet use. Sunscreen use. Work and work Statistician. Physical exam Your health care provider will check your: Height and weight. These may be used to calculate your BMI (body mass index). BMI is a measurement that tells if you are at a healthy weight. Waist circumference. This measures the distance around your waistline. This measurement also tells if you are at a healthy weight and may help predict your risk of certain diseases, such as type 2 diabetes and high blood pressure. Heart rate and blood pressure. Body temperature. Skin for abnormal spots. What immunizations do I need?  Vaccines are usually given at various ages, according to a schedule. Your health care provider will recommend vaccines for you based on your age, medical history, and lifestyle or other factors, such as travel or where you work. What tests do I need? Screening Your health care provider may recommend screening tests for certain conditions. This may  include: Lipid and cholesterol levels. Diabetes screening. This is done by checking your blood sugar (glucose) after you have not eaten for a while (fasting). Hepatitis B test. Hepatitis C test. HIV (human immunodeficiency virus) test. STI (sexually transmitted infection) testing, if you are at risk. Lung cancer screening. Prostate cancer screening. Colorectal cancer screening. Talk with your health care provider about your test results, treatment options, and if necessary, the need for more tests. Follow these instructions at home: Eating and drinking  Eat a diet that includes fresh fruits and vegetables, whole grains, lean protein, and low-fat dairy products. Take vitamin and mineral supplements as recommended by your health care provider. Do not drink alcohol if your health care provider tells you not to drink. If you drink alcohol: Limit how much you have to 0-2 drinks a day. Know how much alcohol is in your drink. In the [place] one drink equals one 12 oz bottle of beer (355 mL), one 5 oz glass of wine (148 mL), or one 1 oz glass of hard liquor (44 mL). Lifestyle Brush your teeth every morning and night with fluoride toothpaste. Floss one time each day. Exercise for at least 30 minutes 5 or more days each week. Do not use any products that contain nicotine or tobacco. These products include cigarettes, chewing tobacco, and vaping devices, such as e-cigarettes. If you need help quitting, ask your health care provider. Do not use drugs. If you are sexually active, practice safe sex. Use a condom or other form of protection to prevent STIs. Take aspirin only as told by your health care provider. Make sure that you understand how much to take and what form to take. Work  with your health care provider to find out whether it is safe and beneficial for you to take aspirin daily. Find healthy ways to manage stress, such as: Meditation, yoga, or listening to music. Journaling. Talking to a  trusted person. Spending time with friends and family. Minimize exposure to UV radiation to reduce your risk of skin cancer. Safety Always wear your seat belt while driving or riding in a vehicle. Do not drive: If you have been drinking alcohol. Do not ride with someone who has been drinking. When you are tired or distracted. While texting. If you have been using any mind-altering substances or drugs. Wear a helmet and other protective equipment during sports activities. If you have firearms in your house, make sure you follow all gun safety procedures. What's next? Go to your health care provider once a year for an annual wellness visit. Ask your health care provider how often you should have your eyes and teeth checked. Stay up to date on all vaccines. This information is not intended to replace advice given to you by your health care provider. Make sure you discuss any questions you have with your health care provider. Document Revised: 04/24/2021 Document Reviewed: 04/24/2021 Elsevier Patient Education  [place].

## 2023-01-13 ENCOUNTER — Other Ambulatory Visit: Payer: Self-pay | Admitting: Family Medicine

## 2023-01-13 DIAGNOSIS — B001 Herpesviral vesicular dermatitis: Secondary | ICD-10-CM

## 2023-06-15 ENCOUNTER — Encounter: Payer: Self-pay | Admitting: Family Medicine

## 2023-06-15 ENCOUNTER — Ambulatory Visit: Payer: 59 | Admitting: Family Medicine

## 2023-06-15 VITALS — BP 130/68 | HR 61 | Temp 97.9°F | Ht 67.5 in | Wt 181.8 lb

## 2023-06-15 DIAGNOSIS — Z0000 Encounter for general adult medical examination without abnormal findings: Secondary | ICD-10-CM | POA: Diagnosis not present

## 2023-06-15 DIAGNOSIS — Z130 Encounter for screening for diseases of the blood and blood-forming organs and certain disorders involving the immune mechanism: Secondary | ICD-10-CM | POA: Diagnosis not present

## 2023-06-15 DIAGNOSIS — Z13220 Encounter for screening for lipoid disorders: Secondary | ICD-10-CM | POA: Diagnosis not present

## 2023-06-15 DIAGNOSIS — Z131 Encounter for screening for diabetes mellitus: Secondary | ICD-10-CM

## 2023-06-15 LAB — HEMOGLOBIN A1C: Hgb A1c MFr Bld: 4.9 % (ref 4.6–6.5)

## 2023-06-15 LAB — COMPREHENSIVE METABOLIC PANEL
ALT: 26 U/L (ref 0–53)
AST: 24 U/L (ref 0–37)
Albumin: 4.4 g/dL (ref 3.5–5.2)
Alkaline Phosphatase: 51 U/L (ref 39–117)
BUN: 13 mg/dL (ref 6–23)
CO2: 29 mEq/L (ref 19–32)
Calcium: 9.4 mg/dL (ref 8.4–10.5)
Chloride: 103 mEq/L (ref 96–112)
Creatinine, Ser: 1.03 mg/dL (ref 0.40–1.50)
GFR: 82.85 mL/min (ref >60.00)
Glucose, Bld: 88 mg/dL (ref 70–99)
Potassium: 4.6 mEq/L (ref 3.5–5.1)
Sodium: 141 mEq/L (ref 135–145)
Total Bilirubin: 0.6 mg/dL (ref 0.2–1.2)
Total Protein: 7.3 g/dL (ref 6.0–8.3)

## 2023-06-15 LAB — CBC WITH DIFFERENTIAL/PLATELET
Basophils Absolute: 0.1 10*3/uL (ref 0.0–0.1)
Basophils Relative: 0.9 % (ref 0.0–3.0)
Eosinophils Absolute: 0.1 10*3/uL (ref 0.0–0.7)
Eosinophils Relative: 1.8 % (ref 0.0–5.0)
HCT: 45.2 % (ref 39.0–52.0)
Hemoglobin: 14.9 g/dL (ref 13.0–17.0)
Lymphocytes Relative: 33.6 % (ref 12.0–46.0)
Lymphs Abs: 2 10*3/uL (ref 0.7–4.0)
MCHC: 32.9 g/dL (ref 30.0–36.0)
MCV: 94.7 fl (ref 78.0–100.0)
Monocytes Absolute: 0.4 10*3/uL (ref 0.1–1.0)
Monocytes Relative: 7 % (ref 3.0–12.0)
Neutro Abs: 3.3 10*3/uL (ref 1.4–7.7)
Neutrophils Relative %: 56.7 % (ref 43.0–77.0)
Platelets: 246 10*3/uL (ref 150.0–400.0)
RBC: 4.77 Mil/uL (ref 4.22–5.81)
RDW: 13.9 % (ref 11.5–15.5)
WBC: 5.8 10*3/uL (ref 4.0–10.5)

## 2023-06-15 LAB — LIPID PANEL
Cholesterol: 204 mg/dL — ABNORMAL HIGH (ref 0–200)
HDL: 64.5 mg/dL (ref >39.00)
LDL Cholesterol: 126 mg/dL — ABNORMAL HIGH (ref 0–99)
NonHDL: 139.89
Total CHOL/HDL Ratio: 3
Triglycerides: 71 mg/dL (ref 0.0–149.0)
VLDL: 14.2 mg/dL (ref 0.0–40.0)

## 2023-06-15 NOTE — Patient Instructions (Signed)
Thanks for coming in today.  If any concerns on labs I will let you know.  Take care.   Preventive Care 34-54 Years Old, Male Preventive care refers to lifestyle choices and visits with your health care provider that can promote health and wellness. Preventive care visits are also called wellness exams. What can I expect for my preventive care visit? Counseling During your preventive care visit, your health care provider may ask about your: Medical history, including: Past medical problems. Family medical history. Current health, including: Emotional well-being. Home life and relationship well-being. Sexual activity. Lifestyle, including: Alcohol, nicotine or tobacco, and drug use. Access to firearms. Diet, exercise, and sleep habits. Safety issues such as seatbelt and bike helmet use. Sunscreen use. Work and work Astronomer. Physical exam Your health care provider will check your: Height and weight. These may be used to calculate your BMI (body mass index). BMI is a measurement that tells if you are at a healthy weight. Waist circumference. This measures the distance around your waistline. This measurement also tells if you are at a healthy weight and may help predict your risk of certain diseases, such as type 2 diabetes and high blood pressure. Heart rate and blood pressure. Body temperature. Skin for abnormal spots. What immunizations do I need?  Vaccines are usually given at various ages, according to a schedule. Your health care provider will recommend vaccines for you based on your age, medical history, and lifestyle or other factors, such as travel or where you work. What tests do I need? Screening Your health care provider may recommend screening tests for certain conditions. This may include: Lipid and cholesterol levels. Diabetes screening. This is done by checking your blood sugar (glucose) after you have not eaten for a while (fasting). Hepatitis B test. Hepatitis  C test. HIV (human immunodeficiency virus) test. STI (sexually transmitted infection) testing, if you are at risk. Lung cancer screening. Prostate cancer screening. Colorectal cancer screening. Talk with your health care provider about your test results, treatment options, and if necessary, the need for more tests. Follow these instructions at home: Eating and drinking  Eat a diet that includes fresh fruits and vegetables, whole grains, lean protein, and low-fat dairy products. Take vitamin and mineral supplements as recommended by your health care provider. Do not drink alcohol if your health care provider tells you not to drink. If you drink alcohol: Limit how much you have to 0-2 drinks a day. Know how much alcohol is in your drink. In the [place] one drink equals one 12 oz bottle of beer (355 mL), one 5 oz glass of wine (148 mL), or one 1 oz glass of hard liquor (44 mL). Lifestyle Brush your teeth every morning and night with fluoride toothpaste. Floss one time each day. Exercise for at least 30 minutes 5 or more days each week. Do not use any products that contain nicotine or tobacco. These products include cigarettes, chewing tobacco, and vaping devices, such as e-cigarettes. If you need help quitting, ask your health care provider. Do not use drugs. If you are sexually active, practice safe sex. Use a condom or other form of protection to prevent STIs. Take aspirin only as told by your health care provider. Make sure that you understand how much to take and what form to take. Work with your health care provider to find out whether it is safe and beneficial for you to take aspirin daily. Find healthy ways to manage stress, such as: Meditation, yoga, or listening  to music. Journaling. Talking to a trusted person. Spending time with friends and family. Minimize exposure to UV radiation to reduce your risk of skin cancer. Safety Always wear your seat belt while driving or riding in a  vehicle. Do not drive: If you have been drinking alcohol. Do not ride with someone who has been drinking. When you are tired or distracted. While texting. If you have been using any mind-altering substances or drugs. Wear a helmet and other protective equipment during sports activities. If you have firearms in your house, make sure you follow all gun safety procedures. What's next? Go to your health care provider once a year for an annual wellness visit. Ask your health care provider how often you should have your eyes and teeth checked. Stay up to date on all vaccines. This information is not intended to replace advice given to you by your health care provider. Make sure you discuss any questions you have with your health care provider. Document Revised: 04/24/2021 Document Reviewed: 04/24/2021 Elsevier Patient Education  2024 [employer].

## 2023-06-15 NOTE — Progress Notes (Signed)
Subjective:  Patient ID: Sean Hernandez, male    DOB: 04/30/1971  Age: 52 y.o. MRN: [id number]  CC:  Chief Complaint  Patient presents with   Annual Exam    Pt is doing well, fasting     HPI Sean Hernandez presents for Annual Exam  Allergic rhinitis - treated with Zyrtec as needed. Daily use - stable.   History of cold sores, Valtrex as needed. Last episode 4 months ago.   Gastroenterology Dr. Myrtie Neither, Dr. Arlie Solomons.  Most recently seen, endoscopy with BRAVO device. Was told looked ok. History of POEM procedure for achalasia, side-to-side ileocolonic anastomosis.  Protonix 40 mg daily in the past for heartburn, off at his last physical.  Colonoscopy 2023, repeat 3 years. Doing well - no recent breakthrough heartburn typically and not needing PPI or meds.      06/15/2023    8:00 AM 06/12/2022    8:11 AM 11/29/2021    9:37 AM 03/23/2020    8:44 AM  Depression screen PHQ 2/9  Decreased Interest 0 0 0 0  Down, Depressed, Hopeless 0 0 0 0  PHQ - 2 Score 0 0 0 0  Altered sleeping 0  0 0  Tired, decreased energy 0  1 0  Change in appetite 0  0 0  Feeling bad or failure about yourself  0  0 0  Trouble concentrating 0  0 0  Moving slowly or fidgety/restless 0  0 0  Suicidal thoughts 0  0 0  PHQ-9 Score 0  1 0    Health Maintenance  Topic Date Due   HIV Screening  Never done   Hepatitis C Screening  Never done   Zoster Vaccines- Shingrix (1 of 2) Never done   COVID-19 Vaccine (2 - Pfizer risk series) 04/06/2020   INFLUENZA VACCINE  06/11/2023   Colonoscopy  04/30/2025   DTaP/Tdap/Td (2 - Td or Tdap) 11/30/2031   HPV VACCINES  Aged Out  Colonoscopy: 04/30/22, Dr. Myrtie Neither, repeat 3 years.  Prostate: does not have family history of prostate cancer The natural history of prostate cancer and ongoing controversy regarding screening and potential treatment outcomes of prostate cancer has been discussed with the patient. The meaning of a false positive PSA and a false negative PSA has been  discussed. He indicates understanding of the limitations of this screening test and wishes NOT to proceed with screening PSA testing, defers to next  year. Lab Results  Component Value Date   PSA 1.25 06/12/2022   PSA 1.08 03/23/2020    Immunization History  Administered Date(s) Administered   Influenza-Unspecified 10/23/2021   PFIZER(Purple Top)SARS-COV-2 Vaccination 03/16/2020   Tdap 11/29/2021  Flu vaccine recommended in the fall.  Covid booster discussed. Considering prior to travel in [place]. Shingles vaccine - unsure, declines at this time.   No results found. Glasses. Optho - over a year. Every 2 years.   Dental:every 6 months.   Alcohol: 4 per week  Tobacco: none  Exercise: 2-3 times per week - walking, stretching and weights.    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   GERD (gastroesophageal reflux disease) 05/11/2020   Dysphagia 05/11/2020   Family history of colon cancer 05/11/2020   Past Medical History:  Diagnosis Date  Allergy    Arthritis    lower back   Asthma    Cancer (HCC) 2021   Colon   Complication of anesthesia    GERD (gastroesophageal reflux disease)    Osteoid osteoma    PONV (postoperative nausea and vomiting)    Past Surgical History:  Procedure Laterality Date   BIOPSY  08/27/2020   Procedure: BIOPSY;  Surgeon: Lemar Lofty., MD;  Location: [facility] ENDOSCOPY;  Service: Gastroenterology;;   COLON RESECTION  10/24/2020   COLON SURGERY     colectomy   COLONOSCOPY  06/13/2020   COLONOSCOPY WITH PROPOFOL N/A 08/27/2020   Procedure: COLONOSCOPY WITH PROPOFOL;  Surgeon: Lemar Lofty., MD;  Location: [facility] ENDOSCOPY;  Service: Gastroenterology;  Laterality: N/A;   DENTAL SURGERY      ESOPHAGEAL MANOMETRY N/A 08/31/2020   Procedure: ESOPHAGEAL MANOMETRY (EM);  Surgeon: Napoleon Form, MD;  Location: [facility] ENDOSCOPY;  Service: Endoscopy;  Laterality: N/A;   ESOPHAGEAL MANOMETRY N/A 08/07/2021   Procedure: ESOPHAGEAL MANOMETRY (EM);  Surgeon: Sherrilyn Rist, MD;  Location: [facility] ENDOSCOPY;  Service: Gastroenterology;  Laterality: N/A;   NASAL RECONSTRUCTION     OSTEOTOMY FIBULA     osteoasteoma   POEM  12/2021   [facility]   SUBMUCOSAL LIFTING INJECTION  08/27/2020   Procedure: SUBMUCOSAL LIFTING INJECTION;  Surgeon: Lemar Lofty., MD;  Location[facility] ENDOSCOPY;  Service: Gastroenterology;;   SUBMUCOSAL TATTOO INJECTION  08/27/2020   Procedure: SUBMUCOSAL TATTOO INJECTION;  Surgeon: Lemar Lofty., MD;  Location: [facility] ENDOSCOPY;  Service: Gastroenterology;;   UPPER GASTROINTESTINAL ENDOSCOPY     UPPER GI ENDOSCOPY  06/13/2020   WISDOM TOOTH EXTRACTION     Allergies  Allergen Reactions   Iodinated Contrast Media Other (See Comments)    Jittery, dizziness, palpitations   Prior to Admission medications   Medication Sig Start Date End Date Taking? Authorizing Provider  cetirizine (ZYRTEC) 10 MG tablet Take 10 mg by mouth daily.   Yes [provider]  valACYclovir (VALTREX) 1000 MG tablet TAKE 2 TABS BY MOUTH ONCE FOR 1 DOSE. REPEAT IN 12 HOURS ONCE. TAKE AT FIRST SIGN OF FLARE. 01/13/23  Yes Shade Flood, MD   Social History   Socioeconomic History   Marital status: Married    Spouse name: Not on file   Number of children: 3   Years of education: Not on file   Highest education level: Not on file  Occupational History   Not on file  Tobacco Use   Smoking status: Never   Smokeless tobacco: Never  Vaping Use   Vaping status: Never Used  Substance and Sexual Activity   Alcohol use: Yes    Alcohol/week: 5.0 standard drinks of alcohol    Types: 5 Standard drinks or equivalent per week    Comment: beer/wine   Drug use: Never   Sexual  activity: Yes  Other Topics Concern   Not on file  Social History Narrative   Not on file   Social Determinants of Health   Financial Resource Strain: Not on file  Food Insecurity: Not on file  Transportation Needs: Not on file  Physical Activity: Not on file  Stress: Not on file  Social Connections: Not on file  Intimate Partner Violence: Not on file    Review of Systems 13 point review of systems per patient health survey noted.  Negative other than as indicated above or in HPI.    Objective:   Vitals:   06/15/23 0758  BP: 130/68  Pulse: 61  Temp: 97.9 F (36.6 C)  TempSrc: Temporal  SpO2: 98%  Weight: 181 lb 12.8 oz (82.5 kg)  Height: 5' 7.5" (1.715 m)     Physical Exam Vitals reviewed.  Constitutional:      Appearance: He is well-developed.  HENT:     Head: Normocephalic and atraumatic.     Right Ear: External ear normal.     Left Ear: External ear normal.  Eyes:     Conjunctiva/sclera: Conjunctivae normal.     Pupils: Pupils are equal, round, and reactive to light.  Neck:     Thyroid: No thyromegaly.  Cardiovascular:     Rate and Rhythm: Normal rate and regular rhythm.     Heart sounds: Normal heart sounds.  Pulmonary:     Effort: Pulmonary effort is normal. No respiratory distress.     Breath sounds: Normal breath sounds. No wheezing.  Abdominal:     General: There is no distension.     Palpations: Abdomen is soft.     Tenderness: There is no abdominal tenderness.  Musculoskeletal:        General: No tenderness. Normal range of motion.     Cervical back: Normal range of motion and neck supple.  Lymphadenopathy:     Cervical: No cervical adenopathy.  Skin:    General: Skin is warm and dry.  Neurological:     Mental Status: He is alert and oriented to person, place, and time.     Deep Tendon Reflexes: Reflexes are normal and symmetric.  Psychiatric:        Behavior: Behavior normal.        Assessment & Plan:  Sean Hernandez is a 52 y.o.  male . Annual physical exam  Screening for hyperlipidemia  Screening for diabetes mellitus  Screening for prostate cancer  Screening, anemia, deficiency, iron -anticipatory guidance as below in AVS, screening labs above. Health maintenance items as above in HPI discussed/recommended as applicable.  Vaccines discussed as above, he is considering shingles vaccine, COVID booster and plans on flu vaccine. -Heartburn, prior achalasia stable.  Allergies controlled with Zyrtec.  Has Valtrex if needed for cold sores.     No orders of the defined types were placed in this encounter.  There are no Patient Instructions on file for this visit.    Signed,   Meredith Staggers, MD [facility], [facility] 06/15/23 8:04 AM

## 2023-12-02 ENCOUNTER — Ambulatory Visit: Payer: 59 | Admitting: Family Medicine

## 2024-06-15 ENCOUNTER — Ambulatory Visit: Payer: 59 | Admitting: Family Medicine

## 2024-06-15 ENCOUNTER — Encounter: Payer: Self-pay | Admitting: Family Medicine

## 2024-06-15 VITALS — BP 128/70 | HR 63 | Temp 97.6°F | Resp 16 | Ht 67.75 in | Wt 178.6 lb

## 2024-06-15 DIAGNOSIS — Z125 Encounter for screening for malignant neoplasm of prostate: Secondary | ICD-10-CM

## 2024-06-15 DIAGNOSIS — Z13220 Encounter for screening for lipoid disorders: Secondary | ICD-10-CM

## 2024-06-15 DIAGNOSIS — Z131 Encounter for screening for diabetes mellitus: Secondary | ICD-10-CM | POA: Diagnosis not present

## 2024-06-15 DIAGNOSIS — E7800 Pure hypercholesterolemia, unspecified: Secondary | ICD-10-CM | POA: Diagnosis not present

## 2024-06-15 DIAGNOSIS — Z0000 Encounter for general adult medical examination without abnormal findings: Secondary | ICD-10-CM

## 2024-06-15 DIAGNOSIS — B001 Herpesviral vesicular dermatitis: Secondary | ICD-10-CM

## 2024-06-15 DIAGNOSIS — Z1159 Encounter for screening for other viral diseases: Secondary | ICD-10-CM

## 2024-06-15 DIAGNOSIS — Z0184 Encounter for antibody response examination: Secondary | ICD-10-CM

## 2024-06-15 DIAGNOSIS — Z114 Encounter for screening for human immunodeficiency virus [HIV]: Secondary | ICD-10-CM

## 2024-06-15 LAB — COMPREHENSIVE METABOLIC PANEL WITH GFR
ALT: 25 U/L (ref 0–53)
AST: 21 U/L (ref 0–37)
Albumin: 4.4 g/dL (ref 3.5–5.2)
Alkaline Phosphatase: 53 U/L (ref 39–117)
BUN: 13 mg/dL (ref 6–23)
CO2: 29 meq/L (ref 19–32)
Calcium: 9.1 mg/dL (ref 8.4–10.5)
Chloride: 103 meq/L (ref 96–112)
Creatinine, Ser: 0.91 mg/dL (ref 0.40–1.50)
GFR: 95.46 mL/min (ref >60.00)
Glucose, Bld: 92 mg/dL (ref 70–99)
Potassium: 4.7 meq/L (ref 3.5–5.1)
Sodium: 139 meq/L (ref 135–145)
Total Bilirubin: 0.8 mg/dL (ref 0.2–1.2)
Total Protein: 7.2 g/dL (ref 6.0–8.3)

## 2024-06-15 LAB — LIPID PANEL
Cholesterol: 208 mg/dL — ABNORMAL HIGH (ref 0–200)
HDL: 57.5 mg/dL (ref >39.00)
LDL Cholesterol: 131 mg/dL — ABNORMAL HIGH (ref 0–99)
NonHDL: 150.79
Total CHOL/HDL Ratio: 4
Triglycerides: 99 mg/dL (ref 0.0–149.0)
VLDL: 19.8 mg/dL (ref 0.0–40.0)

## 2024-06-15 LAB — PSA: PSA: 1.91 ng/mL (ref 0.10–4.00)

## 2024-06-15 NOTE — Patient Instructions (Signed)
 Thanks for coming in today.  I will check some labs and let you know if there are any concerns.  Cholesterol levels were a few points elevated last year and would not recommend any new medicines based on those readings but I will recheck those levels today.  If you are not immune to hepatitis B, we can vaccinate here.  Flu vaccine in the fall.  Repeat colonoscopy due next year.  Let me know if there are questions and take care.   Preventive Care 47-23 Years Old, Male Preventive care refers to lifestyle choices and visits with your health care provider that can promote health and wellness. Preventive care visits are also called wellness exams. What can I expect for my preventive care visit? Counseling During your preventive care visit, your health care provider may ask about your: Medical history, including: Past medical problems. Family medical history. Current health, including: Emotional well-being. Home life and relationship well-being. Sexual activity. Lifestyle, including: Alcohol, nicotine or tobacco, and drug use. Access to firearms. Diet, exercise, and sleep habits. Safety issues such as seatbelt and bike helmet use. Sunscreen use. Work and work Astronomer. Physical exam Your health care provider will check your: Height and weight. These may be used to calculate your BMI (body mass index). BMI is a measurement that tells if you are at a healthy weight. Waist circumference. This measures the distance around your waistline. This measurement also tells if you are at a healthy weight and may help predict your risk of certain diseases, such as type 2 diabetes and high blood pressure. Heart rate and blood pressure. Body temperature. Skin for abnormal spots. What immunizations do I need?  Vaccines are usually given at various ages, according to a schedule. Your health care provider will recommend vaccines for you based on your age, medical history, and lifestyle or other factors, such  as travel or where you work. What tests do I need? Screening Your health care provider may recommend screening tests for certain conditions. This may include: Lipid and cholesterol levels. Diabetes screening. This is done by checking your blood sugar (glucose) after you have not eaten for a while (fasting). Hepatitis B test. Hepatitis C test. HIV (human immunodeficiency virus) test. STI (sexually transmitted infection) testing, if you are at risk. Lung cancer screening. Prostate cancer screening. Colorectal cancer screening. Talk with your health care provider about your test results, treatment options, and if necessary, the need for more tests. Follow these instructions at home: Eating and drinking  Eat a diet that includes fresh fruits and vegetables, whole grains, lean protein, and low-fat dairy products. Take vitamin and mineral supplements as recommended by your health care provider. Do not drink alcohol if your health care provider tells you not to drink. If you drink alcohol: Limit how much you have to 0-2 drinks a day. Know how much alcohol is in your drink. In the [place] one drink equals one 12 oz bottle of beer (355 mL), one 5 oz glass of wine (148 mL), or one 1 oz glass of hard liquor (44 mL). Lifestyle Brush your teeth every morning and night with fluoride toothpaste. Floss one time each day. Exercise for at least 30 minutes 5 or more days each week. Do not use any products that contain nicotine or tobacco. These products include cigarettes, chewing tobacco, and vaping devices, such as e-cigarettes. If you need help quitting, ask your health care provider. Do not use drugs. If you are sexually active, practice safe sex. Use a condom  or other form of protection to prevent STIs. Take aspirin only as told by your health care provider. Make sure that you understand how much to take and what form to take. Work with your health care provider to find out whether it is safe and  beneficial for you to take aspirin daily. Find healthy ways to manage stress, such as: Meditation, yoga, or listening to music. Journaling. Talking to a trusted person. Spending time with friends and family. Minimize exposure to UV radiation to reduce your risk of skin cancer. Safety Always wear your seat belt while driving or riding in a vehicle. Do not drive: If you have been drinking alcohol. Do not ride with someone who has been drinking. When you are tired or distracted. While texting. If you have been using any mind-altering substances or drugs. Wear a helmet and other protective equipment during sports activities. If you have firearms in your house, make sure you follow all gun safety procedures. What's next? Go to your health care provider once a year for an annual wellness visit. Ask your health care provider how often you should have your eyes and teeth checked. Stay up to date on all vaccines. This information is not intended to replace advice given to you by your health care provider. Make sure you discuss any questions you have with your health care provider. Document Revised: 04/24/2021 Document Reviewed: 04/24/2021 Elsevier Patient Education  2024 [employer].

## 2024-06-15 NOTE — Progress Notes (Signed)
 Subjective:  Patient ID: Sean Hernandez, male    DOB: [birth year]  Age: 53 y.o. MRN: [id number]  CC:  Chief Complaint  Patient presents with   Annual Exam    Pt is doing well, no concerns, pt is fasting     HPI Sean Hernandez presents for Annual Exam PCP,[facility], Dr. Legrand, Dr. Francois, history of Bravo device, POEM procedure for achalasia with side-to-side ileocolonic anastomosis.  Prior PPI but was doing well without breakthrough heartburn typically and not needing PPI at his August 2024 physical.  Colonoscopy in 2023 with planned repeat in 3 years. No recent flare of heartburn and no meds needed.   History of cold sores with Valtrex  as needed, last episode 6 months ago.   History of allergic rhinitis with Zyrtec as needed, taken daily except winter. He was treated at urgent care in January for acute bronchospasm. No need for inhaler recently.   Also treated for low back pain few weeks ago, only needed few antiinflammatories, mm relaxant - sx's resolved, no recurrence, normal activities.        06/15/2024    8:17 AM 06/15/2023    8:00 AM 06/12/2022    8:11 AM 11/29/2021    9:37 AM 03/23/2020    8:44 AM  Depression screen PHQ 2/9  Decreased Interest 0 0 0 0 0  Down, Depressed, Hopeless 0 0 0 0 0  PHQ - 2 Score 0 0 0 0 0  Altered sleeping 0 0  0 0  Tired, decreased energy 1 0  1 0  Change in appetite 0 0  0 0  Feeling bad or failure about yourself  0 0  0 0  Trouble concentrating 0 0  0 0  Moving slowly or fidgety/restless 0 0  0 0  Suicidal thoughts 0 0  0 0  PHQ-9 Score 1 0  1 0  Difficult doing work/chores Not difficult at all        Health Maintenance  Topic Date Due   HIV Screening  Never done   Hepatitis C Screening  Never done   Hepatitis B Vaccines (1 of 3 - 19+ 3-dose series) Never done   COVID-19 Vaccine (2 - Pfizer risk series) 04/06/2020   INFLUENZA VACCINE  06/10/2024   Zoster Vaccines- Shingrix (1 of 2) 09/15/2024 (Originally [birth year]20/1990)    Pneumococcal Vaccine: 50+ Years (1 of 1 - PCV) 06/15/2025 (Originally 10/21/2019)   Colonoscopy  04/30/2025   DTaP/Tdap/Td (2 - Td or Tdap) 11/30/2031   HPV VACCINES  Aged Out   Meningococcal B Vaccine  Aged Out  Colonoscopy 04/30/2022 with Dr. Legrand, repeat 3 years. Prostate: does not have family history of prostate cancer The natural history of prostate cancer and ongoing controversy regarding screening and potential treatment outcomes of prostate cancer has been discussed with the patient. The meaning of a false positive PSA and a false negative PSA has been discussed. He indicates understanding of the limitations of this screening test and wishes to proceed with screening PSA testing. Lab Results  Component Value Date   PSA 1.25 06/12/2022   PSA 1.08 03/23/2020     Immunization History  Administered Date(s) Administered   Influenza-Unspecified 10/23/2021   PFIZER(Purple Top)SARS-COV-2 Vaccination 03/16/2020   Tdap 11/29/2021  Unknown if prior hep B vaccine.  Covid booster - defers.  Flu vaccine planned.  Agrees to hep B testing, Hep C and HIV screening.   No results found. He does wear glasses with ophthalmology visits approximately every  2 years. No recent visit - did receive call - encouraged to schedule visit.   Dental: Every 6 months   Alcohol: Approximately 4 drinks per week still.   Tobacco: No tobacco.  Exercise:Few days per week with walking stretches and weights previously. Still same - 2-3 days per week. 1 hour each.  Body mass index is 27.36 kg/m. Wt Readings from Last 3 Encounters:  06/15/24 178 lb 9.6 oz (81 kg)  06/15/23 181 lb 12.8 oz (82.5 kg)  06/12/22 180 lb 12.8 oz (82 kg)    Slight elevated LDL, total cholesterol 204 in August 2024.  No current meds.  Diet/exercise approach.  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   GERD (gastroesophageal reflux disease) 05/11/2020   Dysphagia 05/11/2020   Family history of colon cancer 05/11/2020   Chronic midline low back pain without sciatica 11/24/2016   Precordial chest pain 11/24/2016   Past Medical History:  Diagnosis Date   Allergy    Arthritis    lower back   Asthma    Cancer (HCC) 2021   Colon   Complication of anesthesia    GERD (gastroesophageal reflux disease)    Osteoid osteoma    PONV (postoperative nausea and vomiting)    Past Surgical History:  Procedure Laterality Date   BIOPSY  08/27/2020   Procedure: BIOPSY;  Surgeon: Wilhelmenia Aloha Raddle., MD;  Location: [facility] ENDOSCOPY;  Service: Gastroenterology;;   COLON RESECTION  10/24/2020   COLON SURGERY     colectomy   COLONOSCOPY  06/13/2020   COLONOSCOPY WITH PROPOFOL  N/A 08/27/2020   Procedure: COLONOSCOPY WITH PROPOFOL ;  Surgeon: Wilhelmenia Aloha Raddle., MD;  Location: [facility] ENDOSCOPY;  Service: Gastroenterology;  Laterality: N/A;   DENTAL SURGERY     ESOPHAGEAL MANOMETRY N/A 08/31/2020   Procedure: ESOPHAGEAL MANOMETRY (EM);  Surgeon: Shila Gustav GAILS, MD;  Location: [facility] ENDOSCOPY;  Service: Endoscopy;  Laterality: N/A;   ESOPHAGEAL MANOMETRY N/A 08/07/2021   Procedure: ESOPHAGEAL MANOMETRY (EM);  Surgeon: Legrand Victory LITTIE DOUGLAS, MD;  Location: [facility] ENDOSCOPY;  Service: Gastroenterology;  Laterality: N/A;   NASAL RECONSTRUCTION     OSTEOTOMY FIBULA     osteoasteoma   POEM  12/2021   [facility]   SUBMUCOSAL LIFTING  INJECTION  08/27/2020   Procedure: SUBMUCOSAL LIFTING INJECTION;  Surgeon: Wilhelmenia Aloha Raddle., MD;  Location: [facility] ENDOSCOPY;  Service: Gastroenterology;;   SUBMUCOSAL TATTOO INJECTION  08/27/2020   Procedure: SUBMUCOSAL TATTOO INJECTION;  Surgeon: Wilhelmenia Aloha Raddle., MD;  Location: [facility] ENDOSCOPY;  Service: Gastroenterology;;   UPPER GASTROINTESTINAL ENDOSCOPY     UPPER GI ENDOSCOPY  06/13/2020   WISDOM TOOTH EXTRACTION     Allergies  Allergen Reactions   Iodinated Contrast Media Other (  See Comments)    Jittery, dizziness, palpitations   Iodine     Other Reaction(s): Other   Prior to Admission medications   Medication Sig Start Date End Date Taking? Authorizing Provider  albuterol (VENTOLIN HFA) 108 (90 Base) MCG/ACT inhaler Inhale into the lungs. 12/01/23  Yes [provider]  cyclobenzaprine  (FLEXERIL ) 5 MG tablet Take 5 mg by mouth at bedtime. 06/01/24  Yes [provider]  diclofenac (VOLTAREN) 75 MG EC tablet Take 75 mg by mouth 2 (two) times daily. 06/01/24  Yes [provider]  levalbuterol (XOPENEX HFA) 45 MCG/ACT inhaler Inhale 2 puffs into the lungs. 12/05/23  Yes [provider]  cetirizine (ZYRTEC) 10 MG tablet Take 10 mg by mouth daily.    [provider]  Multiple Vitamin (MULTI-VITAMIN) tablet Take by mouth.    [provider]  valACYclovir  (VALTREX ) 1000 MG tablet TAKE 2 TABS BY MOUTH ONCE FOR 1 DOSE. REPEAT IN 12 HOURS ONCE. TAKE AT FIRST SIGN OF FLARE. 01/13/23   Levora Sean SAUNDERS, MD   Social History   Socioeconomic History   Marital status: Married    Spouse name: Not on file   Number of children: 3   Years of education: Not on file   Highest education level: Not on file  Occupational History   Not on file  Tobacco Use   Smoking status: Never   Smokeless tobacco: Never  Vaping Use   Vaping status: Never Used  Substance and Sexual Activity   Alcohol use: Yes    Alcohol/week: 3.0 standard drinks of  alcohol    Types: 3 Standard drinks or equivalent per week    Comment: beer/wine   Drug use: Never   Sexual activity: Yes  Other Topics Concern   Not on file  Social History Narrative   Not on file   Social Drivers of Health   Financial Resource Strain: Not on file  Food Insecurity: Not on file  Transportation Needs: Not on file  Physical Activity: Not on file  Stress: Not on file  Social Connections: Not on file  Intimate Partner Violence: Not on file    Review of Systems   Objective:   Vitals:   06/15/24 0814  BP: 128/70  Pulse: 63  Resp: 16  Temp: 97.6 F (36.4 C)  TempSrc: Temporal  SpO2: 97%  Weight: 178 lb 9.6 oz (81 kg)  Height: 5' 7.75 (1.721 m)     Physical Exam Vitals reviewed.  Constitutional:      Appearance: He is well-developed.  HENT:     Head: Normocephalic and atraumatic.     Right Ear: External ear normal.     Left Ear: External ear normal.  Eyes:     Conjunctiva/sclera: Conjunctivae normal.     Pupils: Pupils are equal, round, and reactive to light.  Neck:     Thyroid: No thyromegaly.  Cardiovascular:     Rate and Rhythm: Normal rate and regular rhythm.     Heart sounds: Normal heart sounds.  Pulmonary:     Effort: Pulmonary effort is normal. No respiratory distress.     Breath sounds: Normal breath sounds. No wheezing.  Abdominal:     General: There is no distension.     Palpations: Abdomen is soft.     Tenderness: There is no abdominal tenderness.  Musculoskeletal:        General: No tenderness. Normal range of motion.     Cervical back: Normal range of motion and neck  supple.  Lymphadenopathy:     Cervical: No cervical adenopathy.  Skin:    General: Skin is warm and dry.  Neurological:     Mental Status: He is alert and oriented to person, place, and time.     Deep Tendon Reflexes: Reflexes are normal and symmetric.  Psychiatric:        Behavior: Behavior normal.        Assessment & Plan:  Sean Hernandez is a 53  y.o. male . Annual physical exam  - -anticipatory guidance as below in AVS, screening labs above. Health maintenance items as above in HPI discussed/recommended as applicable.   Screening for hyperlipidemia - Plan: Lipid panel  Screening for diabetes mellitus - Plan: Comprehensive metabolic panel with GFR  Elevated LDL cholesterol level - Plan: Lipid panel  Screening for HIV (human immunodeficiency virus) - Plan: HIV Antibody (routine testing w rflx)  Screening for malignant neoplasm of prostate - Plan: PSA  Immunity status testing - Plan: Hepatitis B surface antibody,qualitative  Need for hepatitis C screening test - Plan: Hepatitis C Antibody  Herpesviral vesicular dermatitis  - Has Valtrex  as needed.  Okay to refill if needed.  No orders of the defined types were placed in this encounter.  Patient Instructions  Thanks for coming in today.  I will check some labs and let you know if there are any concerns.  Cholesterol levels were a few points elevated last year and would not recommend any new medicines based on those readings but I will recheck those levels today.  If you are not immune to hepatitis B, we can vaccinate here.  Flu vaccine in the fall.  Repeat colonoscopy due next year.  Let me know if there are questions and take care.   Preventive Care 23-67 Years Old, Male Preventive care refers to lifestyle choices and visits with your health care provider that can promote health and wellness. Preventive care visits are also called wellness exams. What can I expect for my preventive care visit? Counseling During your preventive care visit, your health care provider may ask about your: Medical history, including: Past medical problems. Family medical history. Current health, including: Emotional well-being. Home life and relationship well-being. Sexual activity. Lifestyle, including: Alcohol, nicotine or tobacco, and drug use. Access to firearms. Diet, exercise, and sleep  habits. Safety issues such as seatbelt and bike helmet use. Sunscreen use. Work and work Astronomer. Physical exam Your health care provider will check your: Height and weight. These may be used to calculate your BMI (body mass index). BMI is a measurement that tells if you are at a healthy weight. Waist circumference. This measures the distance around your waistline. This measurement also tells if you are at a healthy weight and may help predict your risk of certain diseases, such as type 2 diabetes and high blood pressure. Heart rate and blood pressure. Body temperature. Skin for abnormal spots. What immunizations do I need?  Vaccines are usually given at various ages, according to a schedule. Your health care provider will recommend vaccines for you based on your age, medical history, and lifestyle or other factors, such as travel or where you work. What tests do I need? Screening Your health care provider may recommend screening tests for certain conditions. This may include: Lipid and cholesterol levels. Diabetes screening. This is done by checking your blood sugar (glucose) after you have not eaten for a while (fasting). Hepatitis B test. Hepatitis C test. HIV (human immunodeficiency virus) test. STI (sexually transmitted  infection) testing, if you are at risk. Lung cancer screening. Prostate cancer screening. Colorectal cancer screening. Talk with your health care provider about your test results, treatment options, and if necessary, the need for more tests. Follow these instructions at home: Eating and drinking  Eat a diet that includes fresh fruits and vegetables, whole grains, lean protein, and low-fat dairy products. Take vitamin and mineral supplements as recommended by your health care provider. Do not drink alcohol if your health care provider tells you not to drink. If you drink alcohol: Limit how much you have to 0-2 drinks a day. Know how much alcohol is in your  drink. In the [place] one drink equals one 12 oz bottle of beer (355 mL), one 5 oz glass of wine (148 mL), or one 1 oz glass of hard liquor (44 mL). Lifestyle Brush your teeth every morning and night with fluoride toothpaste. Floss one time each day. Exercise for at least 30 minutes 5 or more days each week. Do not use any products that contain nicotine or tobacco. These products include cigarettes, chewing tobacco, and vaping devices, such as e-cigarettes. If you need help quitting, ask your health care provider. Do not use drugs. If you are sexually active, practice safe sex. Use a condom or other form of protection to prevent STIs. Take aspirin only as told by your health care provider. Make sure that you understand how much to take and what form to take. Work with your health care provider to find out whether it is safe and beneficial for you to take aspirin daily. Find healthy ways to manage stress, such as: Meditation, yoga, or listening to music. Journaling. Talking to a trusted person. Spending time with friends and family. Minimize exposure to UV radiation to reduce your risk of skin cancer. Safety Always wear your seat belt while driving or riding in a vehicle. Do not drive: If you have been drinking alcohol. Do not ride with someone who has been drinking. When you are tired or distracted. While texting. If you have been using any mind-altering substances or drugs. Wear a helmet and other protective equipment during sports activities. If you have firearms in your house, make sure you follow all gun safety procedures. What's next? Go to your health care provider once a year for an annual wellness visit. Ask your health care provider how often you should have your eyes and teeth checked. Stay up to date on all vaccines. This information is not intended to replace advice given to you by your health care provider. Make sure you discuss any questions you have with your health care  provider. Document Revised: 04/24/2021 Document Reviewed: 04/24/2021 Elsevier Patient Education  2024 [employer].    Signed,   Sean Pines, MD [facility] 06/15/24 8:38 AM

## 2024-06-16 ENCOUNTER — Ambulatory Visit: Payer: Self-pay | Admitting: Family Medicine

## 2024-06-16 LAB — HEPATITIS C ANTIBODY: Hepatitis C Ab: NONREACTIVE

## 2024-06-16 LAB — HEPATITIS B SURFACE ANTIBODY,QUALITATIVE: Hep B S Ab: NONREACTIVE

## 2024-06-16 LAB — HIV ANTIBODY (ROUTINE TESTING W REFLEX): HIV 1&2 Ab, 4th Generation: NONREACTIVE

## 2024-07-26 NOTE — Telephone Encounter (Signed)
 error

## 2025-06-16 ENCOUNTER — Encounter: Admitting: Family Medicine
